# Patient Record
Sex: Male | Born: 1965
Health system: Southern US, Community
[De-identification: ages and names within clinical notes are randomized; demographics above are authoritative.]

## PROBLEM LIST (undated history)

## (undated) DIAGNOSIS — E7800 Pure hypercholesterolemia, unspecified: Secondary | ICD-10-CM

## (undated) DIAGNOSIS — Z9109 Other allergy status, other than to drugs and biological substances: Secondary | ICD-10-CM

## (undated) HISTORY — PX: HERNIA REPAIR: SHX51

## (undated) HISTORY — PX: BLADDER SURGERY: SHX569

---

## 2014-09-10 ENCOUNTER — Emergency Department (HOSPITAL_COMMUNITY): Payer: 59

## 2014-09-10 ENCOUNTER — Emergency Department (HOSPITAL_COMMUNITY)
Admission: EM | Admit: 2014-09-10 | Discharge: 2014-09-10 | Disposition: A | Discharge status: Home / Self Care | Payer: 59 | Attending: Emergency Medicine | Admitting: Emergency Medicine

## 2014-09-10 ENCOUNTER — Encounter (HOSPITAL_COMMUNITY): Payer: Self-pay | Admitting: *Deleted

## 2014-09-10 DIAGNOSIS — R079 Chest pain, unspecified: Secondary | ICD-10-CM | POA: Diagnosis present

## 2014-09-10 DIAGNOSIS — R1011 Right upper quadrant pain: Secondary | ICD-10-CM | POA: Diagnosis not present

## 2014-09-10 DIAGNOSIS — Z7982 Long term (current) use of aspirin: Secondary | ICD-10-CM | POA: Insufficient documentation

## 2014-09-10 DIAGNOSIS — Z79899 Other long term (current) drug therapy: Secondary | ICD-10-CM | POA: Diagnosis not present

## 2014-09-10 DIAGNOSIS — R0789 Other chest pain: Secondary | ICD-10-CM | POA: Diagnosis not present

## 2014-09-10 DIAGNOSIS — R10811 Right upper quadrant abdominal tenderness: Secondary | ICD-10-CM

## 2014-09-10 HISTORY — DX: Other allergy status, other than to drugs and biological substances: Z91.09

## 2014-09-10 LAB — CBC
HCT: 42.5 % (ref 39.0–52.0)
HEMOGLOBIN: 13.8 g/dL (ref 13.0–17.0)
MCH: 27.7 pg (ref 26.0–34.0)
MCHC: 32.5 g/dL (ref 30.0–36.0)
MCV: 85.3 fL (ref 78.0–100.0)
PLATELETS: 239 10*3/uL (ref 150–400)
RBC: 4.98 MIL/uL (ref 4.22–5.81)
RDW: 14 % (ref 11.5–15.5)
WBC: 12 10*3/uL — AB (ref 4.0–10.5)

## 2014-09-10 LAB — DIFFERENTIAL
Basophils Absolute: 0 10*3/uL (ref 0.0–0.1)
Basophils Relative: 0 % (ref 0–1)
EOS PCT: 1 % (ref 0–5)
Eosinophils Absolute: 0.1 10*3/uL (ref 0.0–0.7)
Lymphocytes Relative: 13 % (ref 12–46)
Lymphs Abs: 1.6 10*3/uL (ref 0.7–4.0)
Monocytes Absolute: 0.9 10*3/uL (ref 0.1–1.0)
Monocytes Relative: 8 % (ref 3–12)
NEUTROS PCT: 78 % — AB (ref 43–77)
Neutro Abs: 9.7 10*3/uL — ABNORMAL HIGH (ref 1.7–7.7)

## 2014-09-10 LAB — COMPREHENSIVE METABOLIC PANEL
ALBUMIN: 4.5 g/dL (ref 3.5–5.2)
ALK PHOS: 68 U/L (ref 39–117)
ALT: 25 U/L (ref 0–53)
AST: 29 U/L (ref 0–37)
Anion gap: 9 (ref 5–15)
BUN: 19 mg/dL (ref 6–23)
CALCIUM: 9.4 mg/dL (ref 8.4–10.5)
CHLORIDE: 108 mmol/L (ref 96–112)
CO2: 23 mmol/L (ref 19–32)
Creatinine, Ser: 1.08 mg/dL (ref 0.50–1.35)
GFR calc non Af Amer: 79 mL/min — ABNORMAL LOW (ref >90)
GLUCOSE: 117 mg/dL — AB (ref 70–99)
Potassium: 4.1 mmol/L (ref 3.5–5.1)
SODIUM: 140 mmol/L (ref 135–145)
TOTAL PROTEIN: 7.7 g/dL (ref 6.0–8.3)
Total Bilirubin: 0.8 mg/dL (ref 0.3–1.2)

## 2014-09-10 LAB — TROPONIN I: Troponin I: <0.03 ng/mL (ref <0.031)

## 2014-09-10 LAB — LIPASE, BLOOD: LIPASE: 33 U/L (ref 11–59)

## 2014-09-10 MED ORDER — NAPROXEN 500 MG PO TABS: 500.0000 mg | ORAL_TABLET | Freq: Two times a day (BID) | ORAL | Status: DC | PRN

## 2014-09-10 MED ORDER — SODIUM CHLORIDE 0.9 % IV SOLN
Freq: Once | INTRAVENOUS | Status: AC
  Administered 2014-09-10: 08:00:00 via INTRAVENOUS

## 2014-09-10 MED ORDER — MORPHINE SULFATE 4 MG/ML IJ SOLN
4.0000 mg | Freq: Once | INTRAMUSCULAR | Status: AC
  Administered 2014-09-10: 4 mg via INTRAVENOUS
  Filled 2014-09-10: qty 1

## 2014-09-10 MED ORDER — ASPIRIN 81 MG PO CHEW
243.0000 mg | CHEWABLE_TABLET | Freq: Once | ORAL | Status: AC
  Administered 2014-09-10: 243 mg via ORAL
  Filled 2014-09-10: qty 3

## 2014-09-10 MED ORDER — HYDROCODONE-ACETAMINOPHEN 5-325 MG PO TABS: 1.0000 | ORAL_TABLET | Freq: Four times a day (QID) | ORAL | Status: DC | PRN

## 2014-09-10 NOTE — ED Provider Notes (Signed)
CSN: [id number]     Arrival date & time 09/10/14  0445 History   First MD Initiated Contact with Patient 09/10/14 [phone]     Chief Complaint  Patient presents with  . Chest Pain     (Consider location/radiation/quality/duration/timing/severity/associated sxs/prior Treatment) HPI Comments: Patient states he was trying to go to bed around midnight and when he lay back, developed right-sided chest pain that was worse when he was taking a deep breath.  When he sat up, the pain was slightly relieved.  It's been increasing in intensity since that time.  Denies any nausea or vomiting, diaphoresis, shortness of breath, recent illnesses.  No history of asthma.  Patient sees his immediate last meal was excellently 7 PM parsley 5 hours before you lie down.  He has no history of gallbladder disease.  He has not traveled recently for long periods of time.  He is a nonsmoker.  He does have a history of upper respiratory seasonal allergies  Patient is a 49 y.o. male presenting with chest pain. The history is provided by the patient.  Chest Pain Pain location:  R chest Pain quality: aching   Pain radiates to:  R shoulder Pain radiates to the back: no   Pain severity:  Moderate Onset quality:  Gradual Timing:  Intermittent Progression:  Unchanged Chronicity:  New Context: breathing and lifting   Relieved by:  None tried Exacerbated by: Lying back. Associated symptoms: no abdominal pain, no anxiety, no cough, no diaphoresis, no dizziness, no fever, no heartburn, no lower extremity edema, no nausea, no near-syncope, no numbness, no orthopnea, no palpitations, no shortness of breath and not vomiting   Risk factors: male sex   Risk factors: no diabetes mellitus, no high cholesterol, no hypertension and no smoking     Past Medical History  Diagnosis Date  . Environmental allergies    Past Surgical History  Procedure Laterality Date  . Hernia repair    . Bladder surgery     No family history on  file. History  Substance Use Topics  . Smoking status: Never Smoker   . Smokeless tobacco: Not on file  . Alcohol Use: 4.2 oz/week    7 Cans of beer per week    Review of Systems  Constitutional: Negative for fever and diaphoresis.  Respiratory: Negative for cough, choking, chest tightness, shortness of breath and wheezing.   Cardiovascular: Positive for chest pain. Negative for palpitations, orthopnea, leg swelling and near-syncope.  Gastrointestinal: Negative for heartburn, nausea, vomiting, abdominal pain and rectal pain.  Genitourinary: Negative for dysuria.  Skin: Negative for wound.  Neurological: Negative for dizziness and numbness.  All other systems reviewed and are negative.     Allergies  Adhesive and Loprox  Home Medications   Prior to Admission medications   Medication Sig Start Date End Date Taking? Authorizing Provider  acetaminophen (TYLENOL) 500 MG tablet Take 1,000 mg by mouth every 6 (six) hours as needed for mild pain.   Yes Historical Provider, MD  aspirin 81 MG chewable tablet Chew 162 mg by mouth once.   Yes Historical Provider, MD  cephALEXin (KEFLEX) 500 MG capsule Take 1 capsule by mouth 2 (two) times daily. For 10 days 08/29/14   Historical Provider, MD  Choline Fenofibrate 135 MG capsule Take 135 mg by mouth at bedtime.   Yes Historical Provider, MD  diphenhydrAMINE (BENADRYL) 2 % cream Apply 1 application topically 3 (three) times daily as needed for itching.   Yes Historical Provider, MD  diphenhydrAMINE (BENADRYL) 25 mg capsule Take 25-50 mg by mouth at bedtime as needed for sleep.   Yes Historical Provider, MD  esomeprazole (NEXIUM) 40 MG capsule Take 40 mg by mouth at bedtime as needed (indigestion).   Yes Historical Provider, MD  HYDROcodone-acetaminophen (NORCO) 5-325 MG per tablet Take 1-2 tablets by mouth every 6 (six) hours as needed for severe pain. 09/10/14   Mercedes Strupp Camprubi-Soms, PA-C  montelukast (SINGULAIR) 10 MG tablet Take 10 mg  by mouth at bedtime.   Yes Historical Provider, MD  naproxen (NAPROSYN) 500 MG tablet Take 1 tablet (500 mg total) by mouth 2 (two) times daily as needed for mild pain, moderate pain or headache (TAKE WITH MEALS.). 09/10/14   Patty Sermons Camprubi-Soms, PA-C  simvastatin (ZOCOR) 40 MG tablet Take 40 mg by mouth daily at 6 PM.   Yes Historical Provider, MD  zolpidem (AMBIEN CR) 6.25 MG CR tablet Take 6.25 mg by mouth at bedtime as needed for sleep.   Yes Historical Provider, MD   BP 118/65 mmHg  Pulse 73  Temp(Src) 98.1 F (36.7 C) (Oral)  Resp 22  SpO2 99% Physical Exam  Constitutional: He is oriented to person, place, and time. He appears well-developed and well-nourished.  HENT:  Head: Normocephalic.  Eyes: Pupils are equal, round, and reactive to light.  Neck: Normal range of motion.  Cardiovascular: Normal rate and regular rhythm.   Pulmonary/Chest: Effort normal and breath sounds normal.  Abdominal: He exhibits no distension.  Musculoskeletal: Normal range of motion. He exhibits no edema or tenderness.  Neurological: He is alert and oriented to person, place, and time.  Skin: Skin is warm and dry.    ED Course  Procedures (including critical care time) Labs Review Labs Reviewed  CBC - Abnormal; Notable for the following:    WBC 12.0 (*)    All other components within normal limits  COMPREHENSIVE METABOLIC PANEL - Abnormal; Notable for the following:    Glucose, Bld 117 (*)    GFR calc non Af Amer 79 (*)    All other components within normal limits  DIFFERENTIAL - Abnormal; Notable for the following:    Neutrophils Relative % 78 (*)    Neutro Abs 9.7 (*)    All other components within normal limits  LIPASE, BLOOD  TROPONIN I  TROPONIN I    Imaging Review Dg Chest 2 View  09/10/2014   CLINICAL DATA:  Progressive worsening right-sided chest pain.  EXAM: CHEST  2 VIEW  COMPARISON:  None.  FINDINGS: Lung volumes are low. There is linear bibasilar atelectasis or  scarring. The cardiomediastinal contours are normal. Pulmonary vasculature is normal. No consolidation, pleural effusion, or pneumothorax. No acute osseous abnormalities are seen.  IMPRESSION: Hypoventilatory chest with bibasilar atelectasis.   Electronically Signed   By: Jeb Levering M.D.   On: 09/10/2014 06:16   US Abdomen Limited Ruq  09/10/2014   CLINICAL DATA:  Right upper quadrant tenderness  EXAM: US ABDOMEN LIMITED - RIGHT UPPER QUADRANT  COMPARISON:  None.  FINDINGS: Gallbladder:  No gallstones, gallbladder wall thickening, or pericholecystic fluid. Negative sonographic Murphy's sign.  Common bile duct:  Diameter: 4 mm  Liver:  Hyperechoic hepatic parenchyma, suggesting hepatic steatosis. No focal hepatic lesion is seen.  IMPRESSION: Suspected hepatic steatosis.   Electronically Signed   By: Julian Hy M.D.   On: 09/10/2014 10:16     EKG Interpretation   Date/Time:  Sunday September 10 2014 04:59:41 EST Ventricular Rate:  74 PR Interval:  107 QRS Duration: 99 QT Interval:  392 QTC Calculation: 435 R Axis:   155 Text Interpretation:  Right and left arm electrode reversal Sinus  rhythm  Short PR interval Confirmed by Wilson Singer  MD, STEPHEN ([id number]) on 09/10/2014  8:36:30 AM      MDM   Final diagnoses:  Chest pain  RUQ abdominal tenderness  Atypical chest pain         Garald Balding, NP 09/10/14 1950  Virgel Manifold, MD 09/11/14 [phone]

## 2014-09-10 NOTE — ED Notes (Signed)
Per pt report: pt began having pain in the right side of his chest that began around midnight this past evening.  Pt reports progressively worsening pain.  Pain increases with breathing.  Pt denies SOB, N/V.  No diaphoresis noted. Pt reports pain radiates up to his right shoulder. Pt took low dose aspirin prior to arrival to ED.

## 2014-09-10 NOTE — ED Notes (Signed)
EDP at bedside  

## 2014-09-10 NOTE — Discharge Instructions (Signed)
Your chest pain could be from a variety of different sources. Nothing concerning showed up on your work up today. Please see your regular doctor on Monday for recheck, but if anything changes or worsens then return to the ER. Use naprosyn and norco as needed for pain. Use your home medications as usual.   Chest Pain (Nonspecific) It is often hard to give a specific diagnosis for the cause of chest pain. There is always a chance that your pain could be related to something serious, such as a heart attack or a blood clot in the lungs. You need to follow up with your health care provider for further evaluation. CAUSES   Heartburn.  Pneumonia or bronchitis.  Anxiety or stress.  Inflammation around your heart (pericarditis) or lung (pleuritis or pleurisy).  A blood clot in the lung.  A collapsed lung (pneumothorax). It can develop suddenly on its own (spontaneous pneumothorax) or from trauma to the chest.  Shingles infection (herpes zoster virus). The chest wall is composed of bones, muscles, and cartilage. Any of these can be the source of the pain.  The bones can be bruised by injury.  The muscles or cartilage can be strained by coughing or overwork.  The cartilage can be affected by inflammation and become sore (costochondritis). DIAGNOSIS  Lab tests or other studies may be needed to find the cause of your pain. Your health care provider may have you take a test called an ambulatory electrocardiogram (ECG). An ECG records your heartbeat patterns over a 24-hour period. You may also have other tests, such as:  Transthoracic echocardiogram (TTE). During echocardiography, sound waves are used to evaluate how blood flows through your heart.  Transesophageal echocardiogram (TEE).  Cardiac monitoring. This allows your health care provider to monitor your heart rate and rhythm in real time.  Holter monitor. This is a portable device that records your heartbeat and can help diagnose heart  arrhythmias. It allows your health care provider to track your heart activity for several days, if needed.  Stress tests by exercise or by giving medicine that makes the heart beat faster. TREATMENT   Treatment depends on what may be causing your chest pain. Treatment may include:  Acid blockers for heartburn.  Anti-inflammatory medicine.  Pain medicine for inflammatory conditions.  Antibiotics if an infection is present.  You may be advised to change lifestyle habits. This includes stopping smoking and avoiding alcohol, caffeine, and chocolate.  You may be advised to keep your head raised (elevated) when sleeping. This reduces the chance of acid going backward from your stomach into your esophagus. Most of the time, nonspecific chest pain will improve within 2-3 days with rest and mild pain medicine.  HOME CARE INSTRUCTIONS   If antibiotics were prescribed, take them as directed. Finish them even if you start to feel better.  For the next few days, avoid physical activities that bring on chest pain. Continue physical activities as directed.  Do not use any tobacco products, including cigarettes, chewing tobacco, or electronic cigarettes.  Avoid drinking alcohol.  Only take medicine as directed by your health care provider.  Follow your health care provider's suggestions for further testing if your chest pain does not go away.  Keep any follow-up appointments you made. If you do not go to an appointment, you could develop lasting (chronic) problems with pain. If there is any problem keeping an appointment, call to reschedule. SEEK MEDICAL CARE IF:   Your chest pain does not go away, even  after treatment.  You have a rash with blisters on your chest.  You have a fever. SEEK IMMEDIATE MEDICAL CARE IF:   You have increased chest pain or pain that spreads to your arm, neck, jaw, back, or abdomen.  You have shortness of breath.  You have an increasing cough, or you cough up  blood.  You have severe back or abdominal pain.  You feel nauseous or vomit.  You have severe weakness.  You faint.  You have chills. This is an emergency. Do not wait to see if the pain will go away. Get medical help at once. Call your local emergency services (911 in [place] Do not drive yourself to the hospital. MAKE SURE YOU:   Understand these instructions.  Will watch your condition.  Will get help right away if you are not doing well or get worse. Document Released: 04/30/2005 Document Revised: 07/26/2013 Document Reviewed: 02/24/2008 [facility] Patient Information 2015 [place], [place]. This information is not intended to replace advice given to you by your health care provider. Make sure you discuss any questions you have with your health care provider.

## 2014-09-10 NOTE — ED Provider Notes (Signed)
Care assumed from Aaron Creamer NP at shift change. Pt here with R sided chest pain radiating to R shoulder worse with inspiration. No cardiac history. Ate at 7pm and pain began at midnight. Could be gallbladder although not tender. Has been doing heavy lifting therefore could be musculoskeletal although chest pain not reproducible. No N/V. Cardiac work up pending.   8:07 AM- more history obtained. Pt denies hx of DVT/PE in himself or family. No recent travel or immobilization/surgery. No cough. No LE swelling. Ate tacos last night at 7pm, pain started at 12am. FHx of cardiac disease in father (triple bypass, unknown if he ever had MI). No FHx of DVT/PE. Denies radiation of symptoms to his back, just to R shoulder.   Physical Exam  BP 107/65 mmHg  Pulse 74  Temp(Src) 98 F (36.7 C) (Oral)  Resp 25  SpO2 99%  Physical Exam Gen: afebrile, nontoxic, NAD although slightly tachypneic due to shallow breathing. VSS HEENT: EOMI, MMM Resp: no resp distress, CTAB in all lung fields, no w/r/r, no hypoxia or increased WOB, mildly tachypneic due to shallow breathing, speaking in full sentences, SpO2 97% on RA  CV: RRR, nl s1/s2, no m/r/g, distal pulses intact, no pedal edema. No chest wall TTP Abd: Soft, ND, +BS throughout, with very mild RUQ abd pain that reproduces his chest pain, no r/g/r, neg murphy's (although he reports discomfort during this, he's able to fully inspire), neg mcburney's, no CVA TTP  MsK: moving all extremities with ease Neuro: A&O x4  ED Course  Procedures Results for orders placed or performed during the hospital encounter of 09/10/14  CBC  Result Value Ref Range   WBC 12.0 (H) 4.0 - 10.5 K/uL   RBC 4.98 4.22 - 5.81 MIL/uL   Hemoglobin 13.8 13.0 - 17.0 g/dL   HCT 42.5 39.0 - 52.0 %   MCV 85.3 78.0 - 100.0 fL   MCH 27.7 26.0 - 34.0 pg   MCHC 32.5 30.0 - 36.0 g/dL   RDW 14.0 11.5 - 15.5 %   Platelets 239 150 - 400 K/uL  Comprehensive metabolic panel  Result Value Ref Range    Sodium 140 135 - 145 mmol/L   Potassium 4.1 3.5 - 5.1 mmol/L   Chloride 108 96 - 112 mmol/L   CO2 23 19 - 32 mmol/L   Glucose, Bld 117 (H) 70 - 99 mg/dL   BUN 19 6 - 23 mg/dL   Creatinine, Ser 1.08 0.50 - 1.35 mg/dL   Calcium 9.4 8.4 - 10.5 mg/dL   Total Protein 7.7 6.0 - 8.3 g/dL   Albumin 4.5 3.5 - 5.2 g/dL   AST 29 0 - 37 U/L   ALT 25 0 - 53 U/L   Alkaline Phosphatase 68 39 - 117 U/L   Total Bilirubin 0.8 0.3 - 1.2 mg/dL   GFR calc non Af Amer 79 (L) >90 mL/min   GFR calc Af Amer >90 >90 mL/min   Anion gap 9 5 - 15  Lipase, blood  Result Value Ref Range   Lipase 33 11 - 59 U/L  Troponin I  Result Value Ref Range   Troponin I <0.03 <0.031 ng/mL  Troponin I  Result Value Ref Range   Troponin I <0.03 <0.031 ng/mL  Differential  Result Value Ref Range   Neutrophils Relative % 78 (H) 43 - 77 %   Neutro Abs 9.7 (H) 1.7 - 7.7 K/uL   Lymphocytes Relative 13 12 - 46 %   Lymphs Abs  1.6 0.7 - 4.0 K/uL   Monocytes Relative 8 3 - 12 %   Monocytes Absolute 0.9 0.1 - 1.0 K/uL   Eosinophils Relative 1 0 - 5 %   Eosinophils Absolute 0.1 0.0 - 0.7 K/uL   Basophils Relative 0 0 - 1 %   Basophils Absolute 0.0 0.0 - 0.1 K/uL    Meds ordered this encounter  Medications  . 0.9 %  sodium chloride infusion    Sig:   . aspirin chewable tablet 243 mg    Sig:   . morphine 4 MG/ML injection 4 mg    Sig:    EKG Interpretation  Date/Time:  Sunday September 10 2014 04:59:41 EST Ventricular Rate:  74 PR Interval:  107 QRS Duration: 99 QT Interval:  392 QTC Calculation: 435 R Axis:   155 Text Interpretation:  Right and left arm electrode reversal Sinus  rhythm Short PR interval Confirmed by Wilson Singer  MD, STEPHEN ([id number]) on 09/10/2014 8:36:30 AM    MDM:   ICD-9-CM ICD-10-CM   1. Atypical chest pain 786.59 R07.89   2. Chest pain 786.50 R07.9 DG Chest 2 View     DG Chest 2 View  3. RUQ abdominal tenderness 789.61 R10.811 US Abdomen Limited RUQ     US Abdomen Limited RUQ   8:11 AM CMP  WNL. Lipase WNL. CBC showing mild WBC elevation at 12.0, will obtain differential. Trop I at 5.5hrs s/p onset is neg. CXR showing poor inspiratory effort with bibasilar atelectasis. EKG showing some T inversions which are likely from limb lead reversal, no prior EKG to compare. Will give remainder of ASA dose, and low dose morphine. Will hold on NTG given his BPs being on lower end of normal. Pt has some mild RUQ TTP on my exam, will obtain U/S to eval for gallbladder etiology. PERC negative, and no tachycardia or hypoxia, therefore doubt DVT/PE. HEART score 2. Will hold on GI cocktail, pt states he doesn't have indigestion and doesn't want to try this now. Will reassess shortly.  11:29 AM Second trop neg. Differential shows neutrophilic predominance but not entirely specific. RUQ U/S showing hepatic steatosis. Pain somewhat improved after morphine. VS remain completely stable, tachypnea resolved. Discussed with pt that he has low risk factors and I doubt he needs admission for cardiac work up since this didn't appear to be cardiac in nature. Unclear etiology, but discussed f/up with PCP on Monday, but if any changes or worsening occurred he should return and admission for cardiac work up could be pursued. Will give naprosyn and norco for pain. I explained the diagnosis and have given explicit precautions to return to the ER including for any other new or worsening symptoms. The patient understands and accepts the medical plan as it's been dictated and I have answered their questions. Discharge instructions concerning home care and prescriptions have been given. The patient is STABLE and is discharged to home in good condition.  BP 118/65 mmHg  Pulse 73  Temp(Src) 98.1 F (36.7 C) (Oral)  Resp 22  SpO2 99%    Patty Sermons [place], PA-C 09/10/14 [place], MD 09/11/14 [phone]

## 2016-08-06 DIAGNOSIS — J3081 Allergic rhinitis due to animal (cat) (dog) hair and dander: Secondary | ICD-10-CM | POA: Diagnosis not present

## 2016-08-06 DIAGNOSIS — J3089 Other allergic rhinitis: Secondary | ICD-10-CM | POA: Diagnosis not present

## 2016-08-06 DIAGNOSIS — J301 Allergic rhinitis due to pollen: Secondary | ICD-10-CM | POA: Diagnosis not present

## 2016-08-13 DIAGNOSIS — J3081 Allergic rhinitis due to animal (cat) (dog) hair and dander: Secondary | ICD-10-CM | POA: Diagnosis not present

## 2016-08-13 DIAGNOSIS — J3089 Other allergic rhinitis: Secondary | ICD-10-CM | POA: Diagnosis not present

## 2016-08-13 DIAGNOSIS — J301 Allergic rhinitis due to pollen: Secondary | ICD-10-CM | POA: Diagnosis not present

## 2016-08-25 DIAGNOSIS — E559 Vitamin D deficiency, unspecified: Secondary | ICD-10-CM | POA: Diagnosis not present

## 2016-08-25 DIAGNOSIS — E785 Hyperlipidemia, unspecified: Secondary | ICD-10-CM | POA: Diagnosis not present

## 2016-08-25 DIAGNOSIS — K219 Gastro-esophageal reflux disease without esophagitis: Secondary | ICD-10-CM | POA: Diagnosis not present

## 2016-08-25 DIAGNOSIS — Z0000 Encounter for general adult medical examination without abnormal findings: Secondary | ICD-10-CM | POA: Diagnosis not present

## 2016-08-25 DIAGNOSIS — Z79899 Other long term (current) drug therapy: Secondary | ICD-10-CM | POA: Diagnosis not present

## 2016-08-28 DIAGNOSIS — J3081 Allergic rhinitis due to animal (cat) (dog) hair and dander: Secondary | ICD-10-CM | POA: Diagnosis not present

## 2016-08-28 DIAGNOSIS — J3089 Other allergic rhinitis: Secondary | ICD-10-CM | POA: Diagnosis not present

## 2016-08-28 DIAGNOSIS — J301 Allergic rhinitis due to pollen: Secondary | ICD-10-CM | POA: Diagnosis not present

## 2016-09-03 DIAGNOSIS — J3089 Other allergic rhinitis: Secondary | ICD-10-CM | POA: Diagnosis not present

## 2016-09-03 DIAGNOSIS — J301 Allergic rhinitis due to pollen: Secondary | ICD-10-CM | POA: Diagnosis not present

## 2016-09-03 DIAGNOSIS — J3081 Allergic rhinitis due to animal (cat) (dog) hair and dander: Secondary | ICD-10-CM | POA: Diagnosis not present

## 2016-09-08 DIAGNOSIS — J301 Allergic rhinitis due to pollen: Secondary | ICD-10-CM | POA: Diagnosis not present

## 2016-09-09 DIAGNOSIS — J3089 Other allergic rhinitis: Secondary | ICD-10-CM | POA: Diagnosis not present

## 2016-09-09 DIAGNOSIS — J3081 Allergic rhinitis due to animal (cat) (dog) hair and dander: Secondary | ICD-10-CM | POA: Diagnosis not present

## 2016-09-10 DIAGNOSIS — J3081 Allergic rhinitis due to animal (cat) (dog) hair and dander: Secondary | ICD-10-CM | POA: Diagnosis not present

## 2016-09-10 DIAGNOSIS — J301 Allergic rhinitis due to pollen: Secondary | ICD-10-CM | POA: Diagnosis not present

## 2016-09-10 DIAGNOSIS — J3089 Other allergic rhinitis: Secondary | ICD-10-CM | POA: Diagnosis not present

## 2016-09-17 DIAGNOSIS — J301 Allergic rhinitis due to pollen: Secondary | ICD-10-CM | POA: Diagnosis not present

## 2016-09-17 DIAGNOSIS — J3089 Other allergic rhinitis: Secondary | ICD-10-CM | POA: Diagnosis not present

## 2016-09-17 DIAGNOSIS — J3081 Allergic rhinitis due to animal (cat) (dog) hair and dander: Secondary | ICD-10-CM | POA: Diagnosis not present

## 2016-09-24 DIAGNOSIS — J3081 Allergic rhinitis due to animal (cat) (dog) hair and dander: Secondary | ICD-10-CM | POA: Diagnosis not present

## 2016-09-24 DIAGNOSIS — J301 Allergic rhinitis due to pollen: Secondary | ICD-10-CM | POA: Diagnosis not present

## 2016-09-24 DIAGNOSIS — J3089 Other allergic rhinitis: Secondary | ICD-10-CM | POA: Diagnosis not present

## 2016-09-26 DIAGNOSIS — H903 Sensorineural hearing loss, bilateral: Secondary | ICD-10-CM | POA: Diagnosis not present

## 2016-10-01 DIAGNOSIS — J3081 Allergic rhinitis due to animal (cat) (dog) hair and dander: Secondary | ICD-10-CM | POA: Diagnosis not present

## 2016-10-01 DIAGNOSIS — J3089 Other allergic rhinitis: Secondary | ICD-10-CM | POA: Diagnosis not present

## 2016-10-01 DIAGNOSIS — J301 Allergic rhinitis due to pollen: Secondary | ICD-10-CM | POA: Diagnosis not present

## 2016-10-09 DIAGNOSIS — J3081 Allergic rhinitis due to animal (cat) (dog) hair and dander: Secondary | ICD-10-CM | POA: Diagnosis not present

## 2016-10-09 DIAGNOSIS — J3089 Other allergic rhinitis: Secondary | ICD-10-CM | POA: Diagnosis not present

## 2016-10-09 DIAGNOSIS — J301 Allergic rhinitis due to pollen: Secondary | ICD-10-CM | POA: Diagnosis not present

## 2016-10-15 DIAGNOSIS — J301 Allergic rhinitis due to pollen: Secondary | ICD-10-CM | POA: Diagnosis not present

## 2016-10-15 DIAGNOSIS — J3081 Allergic rhinitis due to animal (cat) (dog) hair and dander: Secondary | ICD-10-CM | POA: Diagnosis not present

## 2016-10-15 DIAGNOSIS — J3089 Other allergic rhinitis: Secondary | ICD-10-CM | POA: Diagnosis not present

## 2016-10-20 DIAGNOSIS — R197 Diarrhea, unspecified: Secondary | ICD-10-CM | POA: Diagnosis not present

## 2016-10-20 DIAGNOSIS — R110 Nausea: Secondary | ICD-10-CM | POA: Diagnosis not present

## 2016-10-22 DIAGNOSIS — J301 Allergic rhinitis due to pollen: Secondary | ICD-10-CM | POA: Diagnosis not present

## 2016-10-22 DIAGNOSIS — J3089 Other allergic rhinitis: Secondary | ICD-10-CM | POA: Diagnosis not present

## 2016-10-22 DIAGNOSIS — J3081 Allergic rhinitis due to animal (cat) (dog) hair and dander: Secondary | ICD-10-CM | POA: Diagnosis not present

## 2016-10-23 DIAGNOSIS — R110 Nausea: Secondary | ICD-10-CM | POA: Diagnosis not present

## 2016-10-23 DIAGNOSIS — R1031 Right lower quadrant pain: Secondary | ICD-10-CM | POA: Diagnosis not present

## 2016-10-29 DIAGNOSIS — J3089 Other allergic rhinitis: Secondary | ICD-10-CM | POA: Diagnosis not present

## 2016-10-29 DIAGNOSIS — J3081 Allergic rhinitis due to animal (cat) (dog) hair and dander: Secondary | ICD-10-CM | POA: Diagnosis not present

## 2016-10-29 DIAGNOSIS — J301 Allergic rhinitis due to pollen: Secondary | ICD-10-CM | POA: Diagnosis not present

## 2016-11-05 DIAGNOSIS — J301 Allergic rhinitis due to pollen: Secondary | ICD-10-CM | POA: Diagnosis not present

## 2016-11-05 DIAGNOSIS — J3089 Other allergic rhinitis: Secondary | ICD-10-CM | POA: Diagnosis not present

## 2016-11-05 DIAGNOSIS — J3081 Allergic rhinitis due to animal (cat) (dog) hair and dander: Secondary | ICD-10-CM | POA: Diagnosis not present

## 2016-11-10 DIAGNOSIS — G4733 Obstructive sleep apnea (adult) (pediatric): Secondary | ICD-10-CM | POA: Diagnosis not present

## 2016-11-12 DIAGNOSIS — J3089 Other allergic rhinitis: Secondary | ICD-10-CM | POA: Diagnosis not present

## 2016-11-12 DIAGNOSIS — J3081 Allergic rhinitis due to animal (cat) (dog) hair and dander: Secondary | ICD-10-CM | POA: Diagnosis not present

## 2016-11-12 DIAGNOSIS — J301 Allergic rhinitis due to pollen: Secondary | ICD-10-CM | POA: Diagnosis not present

## 2016-11-19 DIAGNOSIS — J3081 Allergic rhinitis due to animal (cat) (dog) hair and dander: Secondary | ICD-10-CM | POA: Diagnosis not present

## 2016-11-19 DIAGNOSIS — J3089 Other allergic rhinitis: Secondary | ICD-10-CM | POA: Diagnosis not present

## 2016-11-19 DIAGNOSIS — L821 Other seborrheic keratosis: Secondary | ICD-10-CM | POA: Diagnosis not present

## 2016-11-19 DIAGNOSIS — L820 Inflamed seborrheic keratosis: Secondary | ICD-10-CM | POA: Diagnosis not present

## 2016-11-19 DIAGNOSIS — D1800 Hemangioma unspecified site: Secondary | ICD-10-CM | POA: Diagnosis not present

## 2016-11-19 DIAGNOSIS — L814 Other melanin hyperpigmentation: Secondary | ICD-10-CM | POA: Diagnosis not present

## 2016-11-19 DIAGNOSIS — J301 Allergic rhinitis due to pollen: Secondary | ICD-10-CM | POA: Diagnosis not present

## 2016-11-20 DIAGNOSIS — G4733 Obstructive sleep apnea (adult) (pediatric): Secondary | ICD-10-CM | POA: Diagnosis not present

## 2016-11-26 DIAGNOSIS — J3089 Other allergic rhinitis: Secondary | ICD-10-CM | POA: Diagnosis not present

## 2016-11-26 DIAGNOSIS — J3081 Allergic rhinitis due to animal (cat) (dog) hair and dander: Secondary | ICD-10-CM | POA: Diagnosis not present

## 2016-11-26 DIAGNOSIS — J301 Allergic rhinitis due to pollen: Secondary | ICD-10-CM | POA: Diagnosis not present

## 2016-12-05 DIAGNOSIS — J301 Allergic rhinitis due to pollen: Secondary | ICD-10-CM | POA: Diagnosis not present

## 2016-12-05 DIAGNOSIS — J3089 Other allergic rhinitis: Secondary | ICD-10-CM | POA: Diagnosis not present

## 2016-12-05 DIAGNOSIS — J3081 Allergic rhinitis due to animal (cat) (dog) hair and dander: Secondary | ICD-10-CM | POA: Diagnosis not present

## 2016-12-10 DIAGNOSIS — J301 Allergic rhinitis due to pollen: Secondary | ICD-10-CM | POA: Diagnosis not present

## 2016-12-10 DIAGNOSIS — J3089 Other allergic rhinitis: Secondary | ICD-10-CM | POA: Diagnosis not present

## 2016-12-10 DIAGNOSIS — J3081 Allergic rhinitis due to animal (cat) (dog) hair and dander: Secondary | ICD-10-CM | POA: Diagnosis not present

## 2016-12-17 DIAGNOSIS — J3081 Allergic rhinitis due to animal (cat) (dog) hair and dander: Secondary | ICD-10-CM | POA: Diagnosis not present

## 2016-12-17 DIAGNOSIS — J3089 Other allergic rhinitis: Secondary | ICD-10-CM | POA: Diagnosis not present

## 2016-12-17 DIAGNOSIS — J301 Allergic rhinitis due to pollen: Secondary | ICD-10-CM | POA: Diagnosis not present

## 2016-12-24 DIAGNOSIS — J3089 Other allergic rhinitis: Secondary | ICD-10-CM | POA: Diagnosis not present

## 2016-12-24 DIAGNOSIS — J3081 Allergic rhinitis due to animal (cat) (dog) hair and dander: Secondary | ICD-10-CM | POA: Diagnosis not present

## 2016-12-24 DIAGNOSIS — J301 Allergic rhinitis due to pollen: Secondary | ICD-10-CM | POA: Diagnosis not present

## 2016-12-31 DIAGNOSIS — J3089 Other allergic rhinitis: Secondary | ICD-10-CM | POA: Diagnosis not present

## 2016-12-31 DIAGNOSIS — J301 Allergic rhinitis due to pollen: Secondary | ICD-10-CM | POA: Diagnosis not present

## 2016-12-31 DIAGNOSIS — J3081 Allergic rhinitis due to animal (cat) (dog) hair and dander: Secondary | ICD-10-CM | POA: Diagnosis not present

## 2017-01-05 DIAGNOSIS — H1045 Other chronic allergic conjunctivitis: Secondary | ICD-10-CM | POA: Diagnosis not present

## 2017-01-05 DIAGNOSIS — J3089 Other allergic rhinitis: Secondary | ICD-10-CM | POA: Diagnosis not present

## 2017-01-05 DIAGNOSIS — J301 Allergic rhinitis due to pollen: Secondary | ICD-10-CM | POA: Diagnosis not present

## 2017-01-07 DIAGNOSIS — J3081 Allergic rhinitis due to animal (cat) (dog) hair and dander: Secondary | ICD-10-CM | POA: Diagnosis not present

## 2017-01-07 DIAGNOSIS — J301 Allergic rhinitis due to pollen: Secondary | ICD-10-CM | POA: Diagnosis not present

## 2017-01-07 DIAGNOSIS — J3089 Other allergic rhinitis: Secondary | ICD-10-CM | POA: Diagnosis not present

## 2017-01-21 DIAGNOSIS — J3081 Allergic rhinitis due to animal (cat) (dog) hair and dander: Secondary | ICD-10-CM | POA: Diagnosis not present

## 2017-01-21 DIAGNOSIS — J3089 Other allergic rhinitis: Secondary | ICD-10-CM | POA: Diagnosis not present

## 2017-01-21 DIAGNOSIS — J301 Allergic rhinitis due to pollen: Secondary | ICD-10-CM | POA: Diagnosis not present

## 2017-01-28 DIAGNOSIS — J301 Allergic rhinitis due to pollen: Secondary | ICD-10-CM | POA: Diagnosis not present

## 2017-01-28 DIAGNOSIS — J3081 Allergic rhinitis due to animal (cat) (dog) hair and dander: Secondary | ICD-10-CM | POA: Diagnosis not present

## 2017-01-28 DIAGNOSIS — J3089 Other allergic rhinitis: Secondary | ICD-10-CM | POA: Diagnosis not present

## 2017-02-06 DIAGNOSIS — J3081 Allergic rhinitis due to animal (cat) (dog) hair and dander: Secondary | ICD-10-CM | POA: Diagnosis not present

## 2017-02-06 DIAGNOSIS — J3089 Other allergic rhinitis: Secondary | ICD-10-CM | POA: Diagnosis not present

## 2017-02-06 DIAGNOSIS — J301 Allergic rhinitis due to pollen: Secondary | ICD-10-CM | POA: Diagnosis not present

## 2017-02-09 DIAGNOSIS — H1045 Other chronic allergic conjunctivitis: Secondary | ICD-10-CM | POA: Diagnosis not present

## 2017-02-09 DIAGNOSIS — J301 Allergic rhinitis due to pollen: Secondary | ICD-10-CM | POA: Diagnosis not present

## 2017-02-09 DIAGNOSIS — J3089 Other allergic rhinitis: Secondary | ICD-10-CM | POA: Diagnosis not present

## 2017-02-18 ENCOUNTER — Other Ambulatory Visit: Payer: Self-pay | Admitting: Family Medicine

## 2017-02-18 DIAGNOSIS — J301 Allergic rhinitis due to pollen: Secondary | ICD-10-CM | POA: Diagnosis not present

## 2017-02-18 DIAGNOSIS — J3081 Allergic rhinitis due to animal (cat) (dog) hair and dander: Secondary | ICD-10-CM | POA: Diagnosis not present

## 2017-02-18 DIAGNOSIS — N5089 Other specified disorders of the male genital organs: Secondary | ICD-10-CM

## 2017-02-18 DIAGNOSIS — J3089 Other allergic rhinitis: Secondary | ICD-10-CM | POA: Diagnosis not present

## 2017-02-23 ENCOUNTER — Ambulatory Visit
Admission: RE | Admit: 2017-02-23 | Discharge: 2017-02-23 | Disposition: A | Discharge status: Home / Self Care | Payer: 59 | Source: Ambulatory Visit | Attending: Family Medicine | Admitting: Family Medicine

## 2017-02-23 DIAGNOSIS — N5089 Other specified disorders of the male genital organs: Secondary | ICD-10-CM

## 2017-02-25 DIAGNOSIS — J3089 Other allergic rhinitis: Secondary | ICD-10-CM | POA: Diagnosis not present

## 2017-02-25 DIAGNOSIS — J3081 Allergic rhinitis due to animal (cat) (dog) hair and dander: Secondary | ICD-10-CM | POA: Diagnosis not present

## 2017-02-25 DIAGNOSIS — J301 Allergic rhinitis due to pollen: Secondary | ICD-10-CM | POA: Diagnosis not present

## 2017-03-04 DIAGNOSIS — J3081 Allergic rhinitis due to animal (cat) (dog) hair and dander: Secondary | ICD-10-CM | POA: Diagnosis not present

## 2017-03-04 DIAGNOSIS — J3089 Other allergic rhinitis: Secondary | ICD-10-CM | POA: Diagnosis not present

## 2017-03-04 DIAGNOSIS — J301 Allergic rhinitis due to pollen: Secondary | ICD-10-CM | POA: Diagnosis not present

## 2017-03-11 DIAGNOSIS — J3089 Other allergic rhinitis: Secondary | ICD-10-CM | POA: Diagnosis not present

## 2017-03-11 DIAGNOSIS — J3081 Allergic rhinitis due to animal (cat) (dog) hair and dander: Secondary | ICD-10-CM | POA: Diagnosis not present

## 2017-03-11 DIAGNOSIS — J301 Allergic rhinitis due to pollen: Secondary | ICD-10-CM | POA: Diagnosis not present

## 2017-03-18 DIAGNOSIS — J3089 Other allergic rhinitis: Secondary | ICD-10-CM | POA: Diagnosis not present

## 2017-03-18 DIAGNOSIS — J3081 Allergic rhinitis due to animal (cat) (dog) hair and dander: Secondary | ICD-10-CM | POA: Diagnosis not present

## 2017-03-18 DIAGNOSIS — J301 Allergic rhinitis due to pollen: Secondary | ICD-10-CM | POA: Diagnosis not present

## 2017-03-25 DIAGNOSIS — J301 Allergic rhinitis due to pollen: Secondary | ICD-10-CM | POA: Diagnosis not present

## 2017-03-25 DIAGNOSIS — J3089 Other allergic rhinitis: Secondary | ICD-10-CM | POA: Diagnosis not present

## 2017-03-25 DIAGNOSIS — J3081 Allergic rhinitis due to animal (cat) (dog) hair and dander: Secondary | ICD-10-CM | POA: Diagnosis not present

## 2017-04-01 DIAGNOSIS — J3089 Other allergic rhinitis: Secondary | ICD-10-CM | POA: Diagnosis not present

## 2017-04-01 DIAGNOSIS — J3081 Allergic rhinitis due to animal (cat) (dog) hair and dander: Secondary | ICD-10-CM | POA: Diagnosis not present

## 2017-04-01 DIAGNOSIS — J301 Allergic rhinitis due to pollen: Secondary | ICD-10-CM | POA: Diagnosis not present

## 2017-04-09 DIAGNOSIS — J3081 Allergic rhinitis due to animal (cat) (dog) hair and dander: Secondary | ICD-10-CM | POA: Diagnosis not present

## 2017-04-09 DIAGNOSIS — J301 Allergic rhinitis due to pollen: Secondary | ICD-10-CM | POA: Diagnosis not present

## 2017-04-09 DIAGNOSIS — J3089 Other allergic rhinitis: Secondary | ICD-10-CM | POA: Diagnosis not present

## 2017-04-15 DIAGNOSIS — J3081 Allergic rhinitis due to animal (cat) (dog) hair and dander: Secondary | ICD-10-CM | POA: Diagnosis not present

## 2017-04-15 DIAGNOSIS — J3089 Other allergic rhinitis: Secondary | ICD-10-CM | POA: Diagnosis not present

## 2017-04-15 DIAGNOSIS — J301 Allergic rhinitis due to pollen: Secondary | ICD-10-CM | POA: Diagnosis not present

## 2017-04-22 DIAGNOSIS — J3081 Allergic rhinitis due to animal (cat) (dog) hair and dander: Secondary | ICD-10-CM | POA: Diagnosis not present

## 2017-04-22 DIAGNOSIS — J301 Allergic rhinitis due to pollen: Secondary | ICD-10-CM | POA: Diagnosis not present

## 2017-04-22 DIAGNOSIS — J3089 Other allergic rhinitis: Secondary | ICD-10-CM | POA: Diagnosis not present

## 2017-04-29 DIAGNOSIS — J301 Allergic rhinitis due to pollen: Secondary | ICD-10-CM | POA: Diagnosis not present

## 2017-04-29 DIAGNOSIS — J3081 Allergic rhinitis due to animal (cat) (dog) hair and dander: Secondary | ICD-10-CM | POA: Diagnosis not present

## 2017-04-29 DIAGNOSIS — J3089 Other allergic rhinitis: Secondary | ICD-10-CM | POA: Diagnosis not present

## 2017-05-04 DIAGNOSIS — G4733 Obstructive sleep apnea (adult) (pediatric): Secondary | ICD-10-CM | POA: Diagnosis not present

## 2017-05-07 DIAGNOSIS — J3089 Other allergic rhinitis: Secondary | ICD-10-CM | POA: Diagnosis not present

## 2017-05-07 DIAGNOSIS — J301 Allergic rhinitis due to pollen: Secondary | ICD-10-CM | POA: Diagnosis not present

## 2017-05-07 DIAGNOSIS — J3081 Allergic rhinitis due to animal (cat) (dog) hair and dander: Secondary | ICD-10-CM | POA: Diagnosis not present

## 2017-05-14 DIAGNOSIS — J301 Allergic rhinitis due to pollen: Secondary | ICD-10-CM | POA: Diagnosis not present

## 2017-05-15 DIAGNOSIS — J3089 Other allergic rhinitis: Secondary | ICD-10-CM | POA: Diagnosis not present

## 2017-05-15 DIAGNOSIS — J3081 Allergic rhinitis due to animal (cat) (dog) hair and dander: Secondary | ICD-10-CM | POA: Diagnosis not present

## 2017-05-22 DIAGNOSIS — J301 Allergic rhinitis due to pollen: Secondary | ICD-10-CM | POA: Diagnosis not present

## 2017-05-22 DIAGNOSIS — J3081 Allergic rhinitis due to animal (cat) (dog) hair and dander: Secondary | ICD-10-CM | POA: Diagnosis not present

## 2017-05-22 DIAGNOSIS — J3089 Other allergic rhinitis: Secondary | ICD-10-CM | POA: Diagnosis not present

## 2017-05-27 DIAGNOSIS — J3089 Other allergic rhinitis: Secondary | ICD-10-CM | POA: Diagnosis not present

## 2017-05-27 DIAGNOSIS — J301 Allergic rhinitis due to pollen: Secondary | ICD-10-CM | POA: Diagnosis not present

## 2017-05-27 DIAGNOSIS — J3081 Allergic rhinitis due to animal (cat) (dog) hair and dander: Secondary | ICD-10-CM | POA: Diagnosis not present

## 2017-06-04 DIAGNOSIS — J301 Allergic rhinitis due to pollen: Secondary | ICD-10-CM | POA: Diagnosis not present

## 2017-06-04 DIAGNOSIS — J3089 Other allergic rhinitis: Secondary | ICD-10-CM | POA: Diagnosis not present

## 2017-06-04 DIAGNOSIS — J3081 Allergic rhinitis due to animal (cat) (dog) hair and dander: Secondary | ICD-10-CM | POA: Diagnosis not present

## 2017-06-10 DIAGNOSIS — J3089 Other allergic rhinitis: Secondary | ICD-10-CM | POA: Diagnosis not present

## 2017-06-10 DIAGNOSIS — J301 Allergic rhinitis due to pollen: Secondary | ICD-10-CM | POA: Diagnosis not present

## 2017-06-10 DIAGNOSIS — J3081 Allergic rhinitis due to animal (cat) (dog) hair and dander: Secondary | ICD-10-CM | POA: Diagnosis not present

## 2017-06-17 DIAGNOSIS — J3089 Other allergic rhinitis: Secondary | ICD-10-CM | POA: Diagnosis not present

## 2017-06-17 DIAGNOSIS — J301 Allergic rhinitis due to pollen: Secondary | ICD-10-CM | POA: Diagnosis not present

## 2017-06-17 DIAGNOSIS — J3081 Allergic rhinitis due to animal (cat) (dog) hair and dander: Secondary | ICD-10-CM | POA: Diagnosis not present

## 2017-06-24 DIAGNOSIS — J3089 Other allergic rhinitis: Secondary | ICD-10-CM | POA: Diagnosis not present

## 2017-06-24 DIAGNOSIS — J301 Allergic rhinitis due to pollen: Secondary | ICD-10-CM | POA: Diagnosis not present

## 2017-06-24 DIAGNOSIS — J3081 Allergic rhinitis due to animal (cat) (dog) hair and dander: Secondary | ICD-10-CM | POA: Diagnosis not present

## 2017-07-03 DIAGNOSIS — J301 Allergic rhinitis due to pollen: Secondary | ICD-10-CM | POA: Diagnosis not present

## 2017-07-03 DIAGNOSIS — J3081 Allergic rhinitis due to animal (cat) (dog) hair and dander: Secondary | ICD-10-CM | POA: Diagnosis not present

## 2017-07-03 DIAGNOSIS — J3089 Other allergic rhinitis: Secondary | ICD-10-CM | POA: Diagnosis not present

## 2017-07-07 DIAGNOSIS — R112 Nausea with vomiting, unspecified: Secondary | ICD-10-CM | POA: Diagnosis not present

## 2017-07-07 DIAGNOSIS — R1013 Epigastric pain: Secondary | ICD-10-CM | POA: Diagnosis not present

## 2017-07-07 DIAGNOSIS — R509 Fever, unspecified: Secondary | ICD-10-CM | POA: Diagnosis not present

## 2017-07-15 DIAGNOSIS — J3081 Allergic rhinitis due to animal (cat) (dog) hair and dander: Secondary | ICD-10-CM | POA: Diagnosis not present

## 2017-07-15 DIAGNOSIS — J3089 Other allergic rhinitis: Secondary | ICD-10-CM | POA: Diagnosis not present

## 2017-07-15 DIAGNOSIS — J301 Allergic rhinitis due to pollen: Secondary | ICD-10-CM | POA: Diagnosis not present

## 2017-07-22 DIAGNOSIS — J3089 Other allergic rhinitis: Secondary | ICD-10-CM | POA: Diagnosis not present

## 2017-07-22 DIAGNOSIS — J301 Allergic rhinitis due to pollen: Secondary | ICD-10-CM | POA: Diagnosis not present

## 2017-07-22 DIAGNOSIS — J3081 Allergic rhinitis due to animal (cat) (dog) hair and dander: Secondary | ICD-10-CM | POA: Diagnosis not present

## 2017-07-23 DIAGNOSIS — R253 Fasciculation: Secondary | ICD-10-CM | POA: Diagnosis not present

## 2017-07-23 DIAGNOSIS — M79672 Pain in left foot: Secondary | ICD-10-CM | POA: Diagnosis not present

## 2017-08-12 DIAGNOSIS — J301 Allergic rhinitis due to pollen: Secondary | ICD-10-CM | POA: Diagnosis not present

## 2017-08-12 DIAGNOSIS — J3089 Other allergic rhinitis: Secondary | ICD-10-CM | POA: Diagnosis not present

## 2017-08-12 DIAGNOSIS — J3081 Allergic rhinitis due to animal (cat) (dog) hair and dander: Secondary | ICD-10-CM | POA: Diagnosis not present

## 2017-08-19 DIAGNOSIS — J3081 Allergic rhinitis due to animal (cat) (dog) hair and dander: Secondary | ICD-10-CM | POA: Diagnosis not present

## 2017-08-19 DIAGNOSIS — J301 Allergic rhinitis due to pollen: Secondary | ICD-10-CM | POA: Diagnosis not present

## 2017-08-19 DIAGNOSIS — J3089 Other allergic rhinitis: Secondary | ICD-10-CM | POA: Diagnosis not present

## 2017-08-26 DIAGNOSIS — J3089 Other allergic rhinitis: Secondary | ICD-10-CM | POA: Diagnosis not present

## 2017-08-26 DIAGNOSIS — J3081 Allergic rhinitis due to animal (cat) (dog) hair and dander: Secondary | ICD-10-CM | POA: Diagnosis not present

## 2017-08-26 DIAGNOSIS — J301 Allergic rhinitis due to pollen: Secondary | ICD-10-CM | POA: Diagnosis not present

## 2017-09-04 DIAGNOSIS — J301 Allergic rhinitis due to pollen: Secondary | ICD-10-CM | POA: Diagnosis not present

## 2017-09-04 DIAGNOSIS — J3089 Other allergic rhinitis: Secondary | ICD-10-CM | POA: Diagnosis not present

## 2017-09-04 DIAGNOSIS — J3081 Allergic rhinitis due to animal (cat) (dog) hair and dander: Secondary | ICD-10-CM | POA: Diagnosis not present

## 2017-09-14 DIAGNOSIS — J301 Allergic rhinitis due to pollen: Secondary | ICD-10-CM | POA: Diagnosis not present

## 2017-09-14 DIAGNOSIS — J3081 Allergic rhinitis due to animal (cat) (dog) hair and dander: Secondary | ICD-10-CM | POA: Diagnosis not present

## 2017-09-14 DIAGNOSIS — J3089 Other allergic rhinitis: Secondary | ICD-10-CM | POA: Diagnosis not present

## 2017-09-23 DIAGNOSIS — J301 Allergic rhinitis due to pollen: Secondary | ICD-10-CM | POA: Diagnosis not present

## 2017-09-23 DIAGNOSIS — J3089 Other allergic rhinitis: Secondary | ICD-10-CM | POA: Diagnosis not present

## 2017-09-23 DIAGNOSIS — J3081 Allergic rhinitis due to animal (cat) (dog) hair and dander: Secondary | ICD-10-CM | POA: Diagnosis not present

## 2017-10-02 DIAGNOSIS — J3089 Other allergic rhinitis: Secondary | ICD-10-CM | POA: Diagnosis not present

## 2017-10-02 DIAGNOSIS — J3081 Allergic rhinitis due to animal (cat) (dog) hair and dander: Secondary | ICD-10-CM | POA: Diagnosis not present

## 2017-10-02 DIAGNOSIS — J301 Allergic rhinitis due to pollen: Secondary | ICD-10-CM | POA: Diagnosis not present

## 2017-10-07 DIAGNOSIS — J3081 Allergic rhinitis due to animal (cat) (dog) hair and dander: Secondary | ICD-10-CM | POA: Diagnosis not present

## 2017-10-07 DIAGNOSIS — J3089 Other allergic rhinitis: Secondary | ICD-10-CM | POA: Diagnosis not present

## 2017-10-07 DIAGNOSIS — J301 Allergic rhinitis due to pollen: Secondary | ICD-10-CM | POA: Diagnosis not present

## 2017-10-14 DIAGNOSIS — J3081 Allergic rhinitis due to animal (cat) (dog) hair and dander: Secondary | ICD-10-CM | POA: Diagnosis not present

## 2017-10-14 DIAGNOSIS — J3089 Other allergic rhinitis: Secondary | ICD-10-CM | POA: Diagnosis not present

## 2017-10-14 DIAGNOSIS — J301 Allergic rhinitis due to pollen: Secondary | ICD-10-CM | POA: Diagnosis not present

## 2017-10-23 DIAGNOSIS — J3081 Allergic rhinitis due to animal (cat) (dog) hair and dander: Secondary | ICD-10-CM | POA: Diagnosis not present

## 2017-10-23 DIAGNOSIS — J3089 Other allergic rhinitis: Secondary | ICD-10-CM | POA: Diagnosis not present

## 2017-10-23 DIAGNOSIS — J301 Allergic rhinitis due to pollen: Secondary | ICD-10-CM | POA: Diagnosis not present

## 2017-10-27 DIAGNOSIS — G4733 Obstructive sleep apnea (adult) (pediatric): Secondary | ICD-10-CM | POA: Diagnosis not present

## 2017-10-28 DIAGNOSIS — J301 Allergic rhinitis due to pollen: Secondary | ICD-10-CM | POA: Diagnosis not present

## 2017-10-28 DIAGNOSIS — J3089 Other allergic rhinitis: Secondary | ICD-10-CM | POA: Diagnosis not present

## 2017-10-28 DIAGNOSIS — J3081 Allergic rhinitis due to animal (cat) (dog) hair and dander: Secondary | ICD-10-CM | POA: Diagnosis not present

## 2017-11-05 DIAGNOSIS — J3081 Allergic rhinitis due to animal (cat) (dog) hair and dander: Secondary | ICD-10-CM | POA: Diagnosis not present

## 2017-11-05 DIAGNOSIS — J301 Allergic rhinitis due to pollen: Secondary | ICD-10-CM | POA: Diagnosis not present

## 2017-11-05 DIAGNOSIS — J3089 Other allergic rhinitis: Secondary | ICD-10-CM | POA: Diagnosis not present

## 2017-11-12 DIAGNOSIS — J3089 Other allergic rhinitis: Secondary | ICD-10-CM | POA: Diagnosis not present

## 2017-11-12 DIAGNOSIS — J301 Allergic rhinitis due to pollen: Secondary | ICD-10-CM | POA: Diagnosis not present

## 2017-11-12 DIAGNOSIS — J3081 Allergic rhinitis due to animal (cat) (dog) hair and dander: Secondary | ICD-10-CM | POA: Diagnosis not present

## 2017-11-19 DIAGNOSIS — J301 Allergic rhinitis due to pollen: Secondary | ICD-10-CM | POA: Diagnosis not present

## 2017-11-19 DIAGNOSIS — J3089 Other allergic rhinitis: Secondary | ICD-10-CM | POA: Diagnosis not present

## 2017-11-19 DIAGNOSIS — J3081 Allergic rhinitis due to animal (cat) (dog) hair and dander: Secondary | ICD-10-CM | POA: Diagnosis not present

## 2017-11-26 DIAGNOSIS — L821 Other seborrheic keratosis: Secondary | ICD-10-CM | POA: Diagnosis not present

## 2017-11-26 DIAGNOSIS — L814 Other melanin hyperpigmentation: Secondary | ICD-10-CM | POA: Diagnosis not present

## 2017-11-26 DIAGNOSIS — D1800 Hemangioma unspecified site: Secondary | ICD-10-CM | POA: Diagnosis not present

## 2017-11-26 DIAGNOSIS — L820 Inflamed seborrheic keratosis: Secondary | ICD-10-CM | POA: Diagnosis not present

## 2017-11-26 DIAGNOSIS — D485 Neoplasm of uncertain behavior of skin: Secondary | ICD-10-CM | POA: Diagnosis not present

## 2017-11-26 DIAGNOSIS — L570 Actinic keratosis: Secondary | ICD-10-CM | POA: Diagnosis not present

## 2017-11-27 DIAGNOSIS — J301 Allergic rhinitis due to pollen: Secondary | ICD-10-CM | POA: Diagnosis not present

## 2017-11-27 DIAGNOSIS — J3081 Allergic rhinitis due to animal (cat) (dog) hair and dander: Secondary | ICD-10-CM | POA: Diagnosis not present

## 2017-11-27 DIAGNOSIS — J3089 Other allergic rhinitis: Secondary | ICD-10-CM | POA: Diagnosis not present

## 2017-12-09 DIAGNOSIS — J3081 Allergic rhinitis due to animal (cat) (dog) hair and dander: Secondary | ICD-10-CM | POA: Diagnosis not present

## 2017-12-09 DIAGNOSIS — J301 Allergic rhinitis due to pollen: Secondary | ICD-10-CM | POA: Diagnosis not present

## 2017-12-09 DIAGNOSIS — J3089 Other allergic rhinitis: Secondary | ICD-10-CM | POA: Diagnosis not present

## 2017-12-16 DIAGNOSIS — J301 Allergic rhinitis due to pollen: Secondary | ICD-10-CM | POA: Diagnosis not present

## 2017-12-16 DIAGNOSIS — J3089 Other allergic rhinitis: Secondary | ICD-10-CM | POA: Diagnosis not present

## 2017-12-16 DIAGNOSIS — J3081 Allergic rhinitis due to animal (cat) (dog) hair and dander: Secondary | ICD-10-CM | POA: Diagnosis not present

## 2017-12-17 DIAGNOSIS — E785 Hyperlipidemia, unspecified: Secondary | ICD-10-CM | POA: Diagnosis not present

## 2017-12-17 DIAGNOSIS — K219 Gastro-esophageal reflux disease without esophagitis: Secondary | ICD-10-CM | POA: Diagnosis not present

## 2017-12-17 DIAGNOSIS — E559 Vitamin D deficiency, unspecified: Secondary | ICD-10-CM | POA: Diagnosis not present

## 2017-12-24 DIAGNOSIS — J3089 Other allergic rhinitis: Secondary | ICD-10-CM | POA: Diagnosis not present

## 2017-12-24 DIAGNOSIS — J301 Allergic rhinitis due to pollen: Secondary | ICD-10-CM | POA: Diagnosis not present

## 2017-12-24 DIAGNOSIS — J3081 Allergic rhinitis due to animal (cat) (dog) hair and dander: Secondary | ICD-10-CM | POA: Diagnosis not present

## 2017-12-31 DIAGNOSIS — J301 Allergic rhinitis due to pollen: Secondary | ICD-10-CM | POA: Diagnosis not present

## 2017-12-31 DIAGNOSIS — J3081 Allergic rhinitis due to animal (cat) (dog) hair and dander: Secondary | ICD-10-CM | POA: Diagnosis not present

## 2017-12-31 DIAGNOSIS — J3089 Other allergic rhinitis: Secondary | ICD-10-CM | POA: Diagnosis not present

## 2018-01-04 DIAGNOSIS — G4733 Obstructive sleep apnea (adult) (pediatric): Secondary | ICD-10-CM | POA: Diagnosis not present

## 2018-01-07 DIAGNOSIS — J301 Allergic rhinitis due to pollen: Secondary | ICD-10-CM | POA: Diagnosis not present

## 2018-01-07 DIAGNOSIS — J3089 Other allergic rhinitis: Secondary | ICD-10-CM | POA: Diagnosis not present

## 2018-01-07 DIAGNOSIS — J3081 Allergic rhinitis due to animal (cat) (dog) hair and dander: Secondary | ICD-10-CM | POA: Diagnosis not present

## 2018-01-13 DIAGNOSIS — J3081 Allergic rhinitis due to animal (cat) (dog) hair and dander: Secondary | ICD-10-CM | POA: Diagnosis not present

## 2018-01-13 DIAGNOSIS — J3089 Other allergic rhinitis: Secondary | ICD-10-CM | POA: Diagnosis not present

## 2018-01-13 DIAGNOSIS — J301 Allergic rhinitis due to pollen: Secondary | ICD-10-CM | POA: Diagnosis not present

## 2018-01-22 DIAGNOSIS — J301 Allergic rhinitis due to pollen: Secondary | ICD-10-CM | POA: Diagnosis not present

## 2018-01-22 DIAGNOSIS — J3089 Other allergic rhinitis: Secondary | ICD-10-CM | POA: Diagnosis not present

## 2018-01-22 DIAGNOSIS — H1045 Other chronic allergic conjunctivitis: Secondary | ICD-10-CM | POA: Diagnosis not present

## 2018-01-27 DIAGNOSIS — J3089 Other allergic rhinitis: Secondary | ICD-10-CM | POA: Diagnosis not present

## 2018-01-27 DIAGNOSIS — J301 Allergic rhinitis due to pollen: Secondary | ICD-10-CM | POA: Diagnosis not present

## 2018-01-27 DIAGNOSIS — J3081 Allergic rhinitis due to animal (cat) (dog) hair and dander: Secondary | ICD-10-CM | POA: Diagnosis not present

## 2018-02-02 DIAGNOSIS — J029 Acute pharyngitis, unspecified: Secondary | ICD-10-CM | POA: Diagnosis not present

## 2018-02-11 DIAGNOSIS — J3089 Other allergic rhinitis: Secondary | ICD-10-CM | POA: Diagnosis not present

## 2018-02-11 DIAGNOSIS — J301 Allergic rhinitis due to pollen: Secondary | ICD-10-CM | POA: Diagnosis not present

## 2018-02-11 DIAGNOSIS — J3081 Allergic rhinitis due to animal (cat) (dog) hair and dander: Secondary | ICD-10-CM | POA: Diagnosis not present

## 2018-02-18 DIAGNOSIS — J3081 Allergic rhinitis due to animal (cat) (dog) hair and dander: Secondary | ICD-10-CM | POA: Diagnosis not present

## 2018-02-18 DIAGNOSIS — J3089 Other allergic rhinitis: Secondary | ICD-10-CM | POA: Diagnosis not present

## 2018-02-18 DIAGNOSIS — J301 Allergic rhinitis due to pollen: Secondary | ICD-10-CM | POA: Diagnosis not present

## 2018-02-26 DIAGNOSIS — J301 Allergic rhinitis due to pollen: Secondary | ICD-10-CM | POA: Diagnosis not present

## 2018-02-26 DIAGNOSIS — J3081 Allergic rhinitis due to animal (cat) (dog) hair and dander: Secondary | ICD-10-CM | POA: Diagnosis not present

## 2018-02-26 DIAGNOSIS — J3089 Other allergic rhinitis: Secondary | ICD-10-CM | POA: Diagnosis not present

## 2018-03-05 DIAGNOSIS — J301 Allergic rhinitis due to pollen: Secondary | ICD-10-CM | POA: Diagnosis not present

## 2018-03-05 DIAGNOSIS — J3089 Other allergic rhinitis: Secondary | ICD-10-CM | POA: Diagnosis not present

## 2018-03-05 DIAGNOSIS — J3081 Allergic rhinitis due to animal (cat) (dog) hair and dander: Secondary | ICD-10-CM | POA: Diagnosis not present

## 2018-03-11 DIAGNOSIS — J3081 Allergic rhinitis due to animal (cat) (dog) hair and dander: Secondary | ICD-10-CM | POA: Diagnosis not present

## 2018-03-11 DIAGNOSIS — J301 Allergic rhinitis due to pollen: Secondary | ICD-10-CM | POA: Diagnosis not present

## 2018-03-11 DIAGNOSIS — J3089 Other allergic rhinitis: Secondary | ICD-10-CM | POA: Diagnosis not present

## 2018-03-18 DIAGNOSIS — J301 Allergic rhinitis due to pollen: Secondary | ICD-10-CM | POA: Diagnosis not present

## 2018-03-18 DIAGNOSIS — J3089 Other allergic rhinitis: Secondary | ICD-10-CM | POA: Diagnosis not present

## 2018-03-18 DIAGNOSIS — J3081 Allergic rhinitis due to animal (cat) (dog) hair and dander: Secondary | ICD-10-CM | POA: Diagnosis not present

## 2018-03-18 IMAGING — US US SCROTUM
1 series · 14 of 25 positions shown · non-contrast
Comparison: None.

CLINICAL DATA: Right testicular lump.

EXAM:
ULTRASOUND OF SCROTUM
TECHNIQUE: Complete ultrasound examination of the testicles, epididymis, and
other scrotal structures was performed.

[Series 1: us scrotum · 0.06mm/px · 14 of 61 slices shown]
[im 1/61]
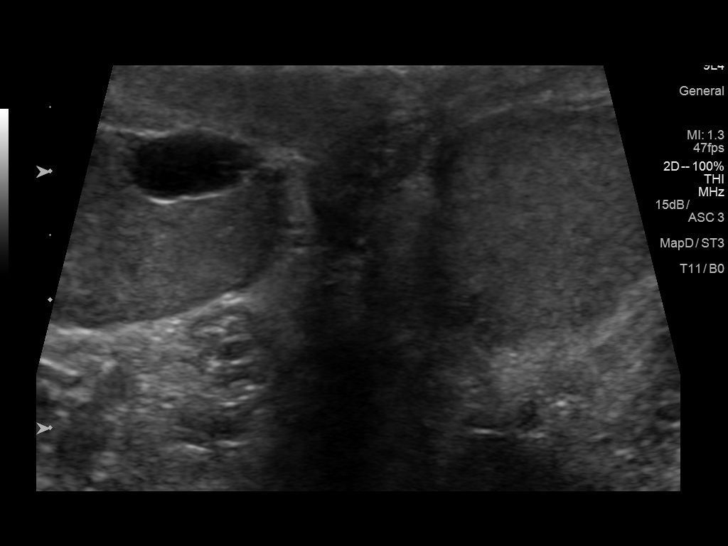
[im 6/61]
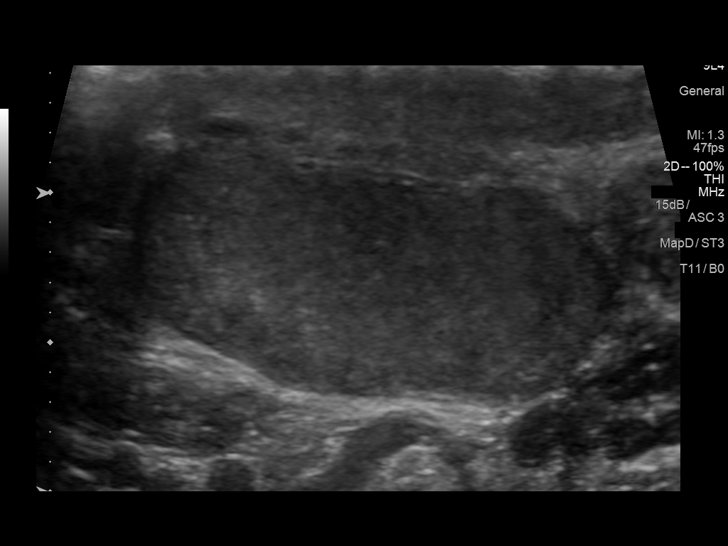
[im 11/61]
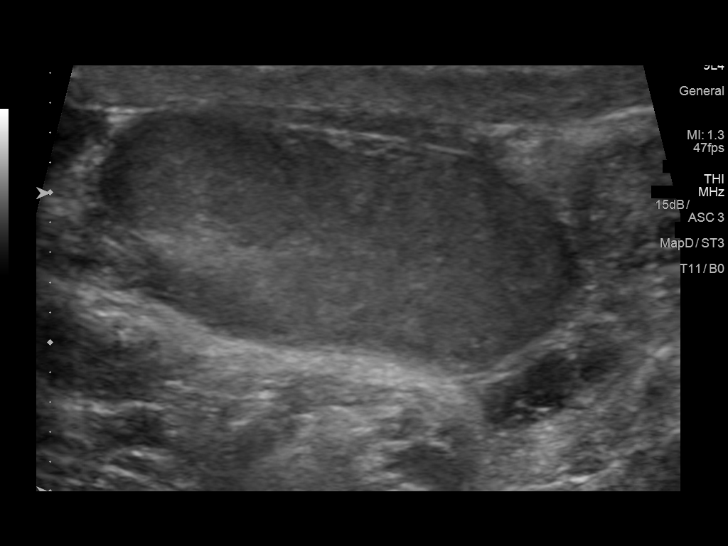
[im 16/61]
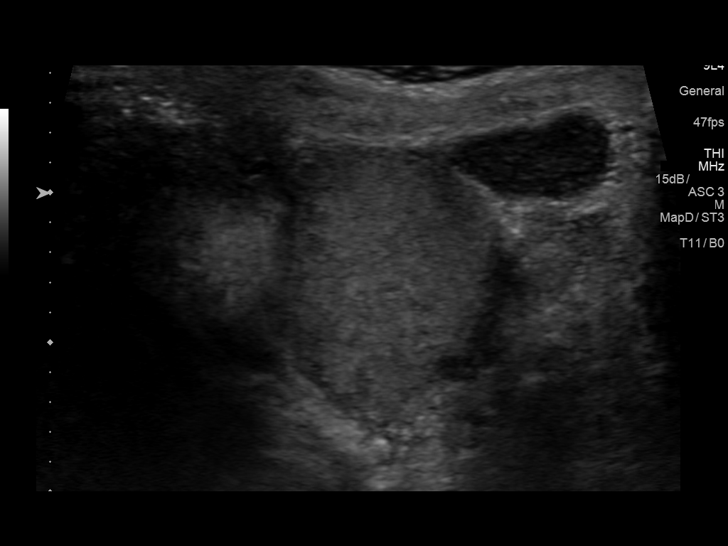
[im 21/61]
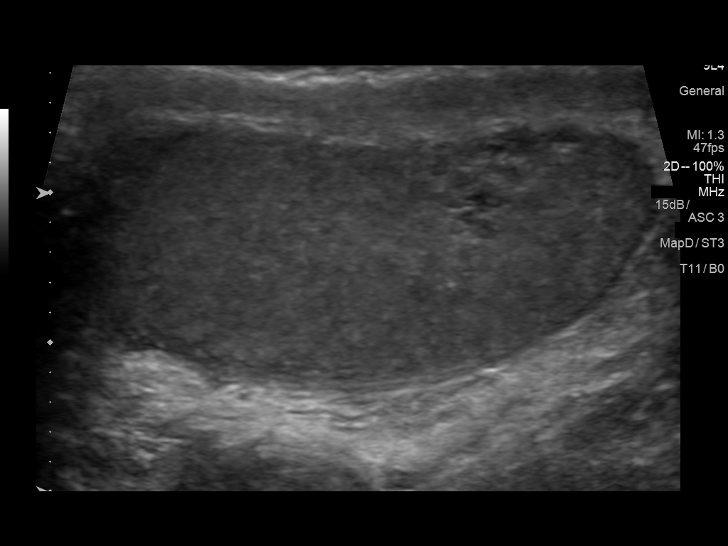
[im 23/61]
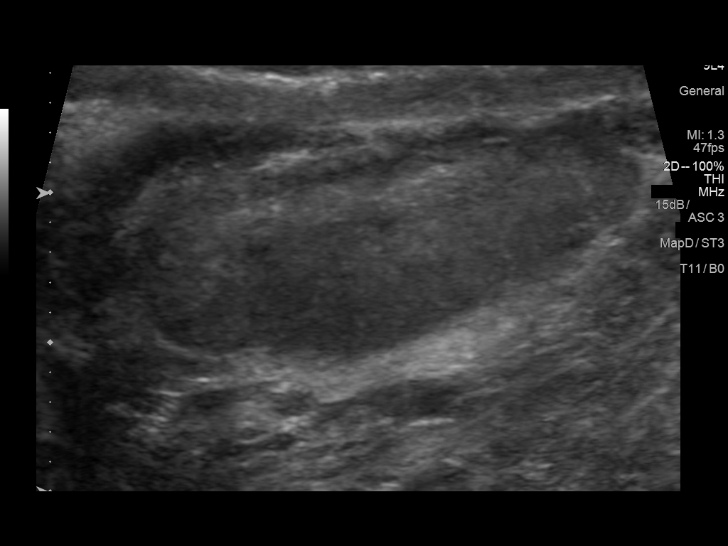
[im 28/61]
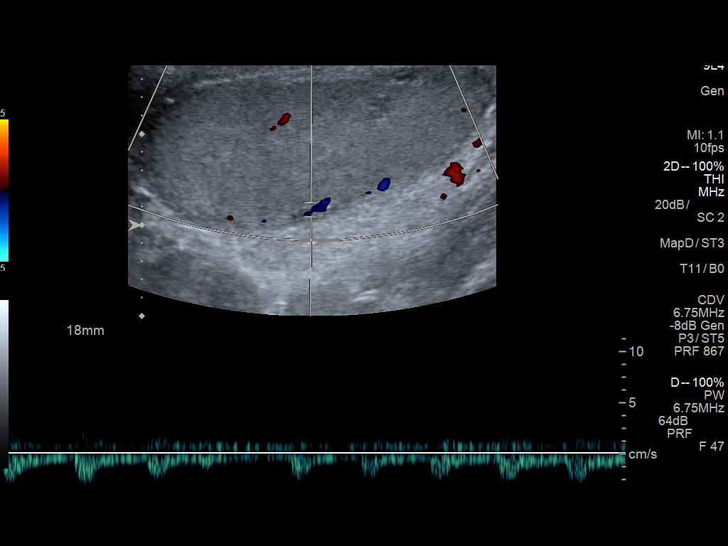
[im 33/61]
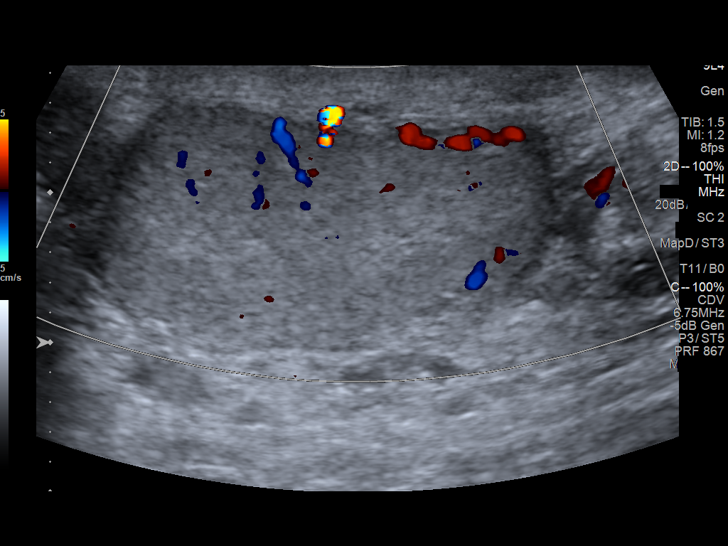
[im 38/61]
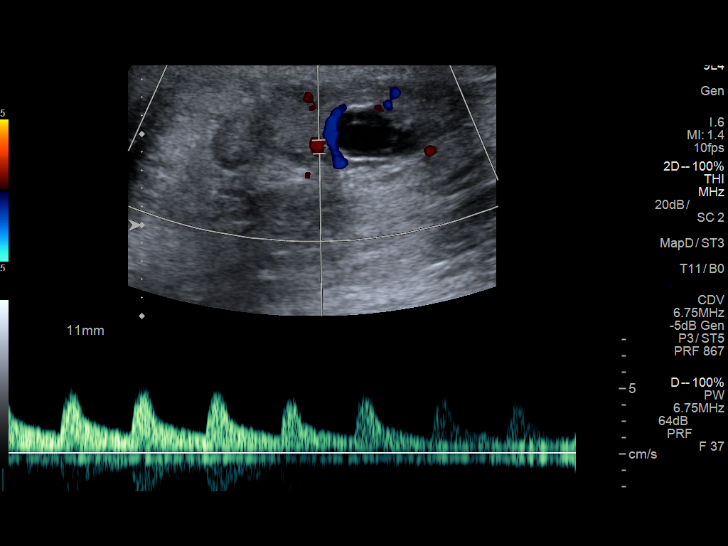
[im 41/61]
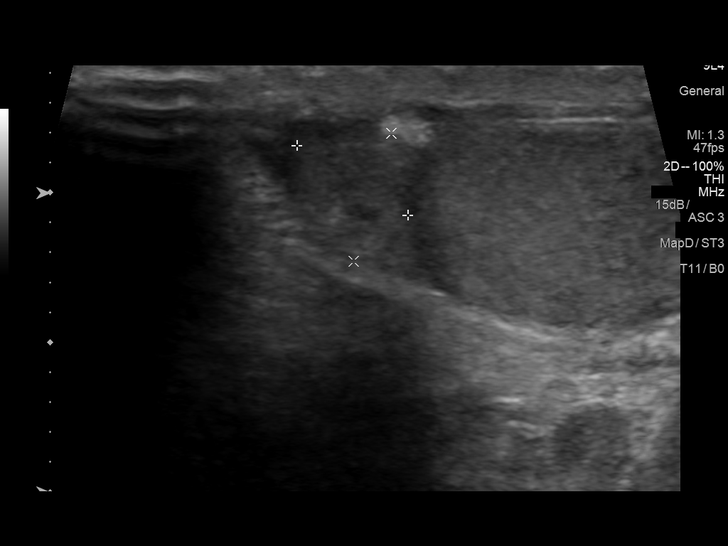
[im 46/61]
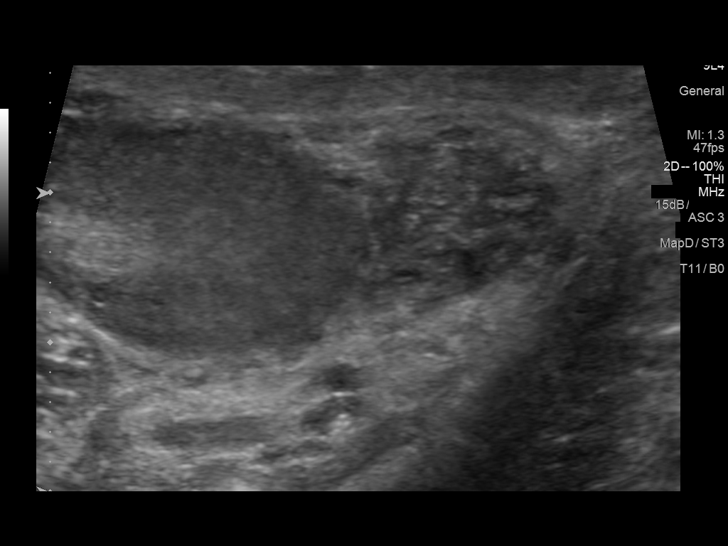
[im 51/61]
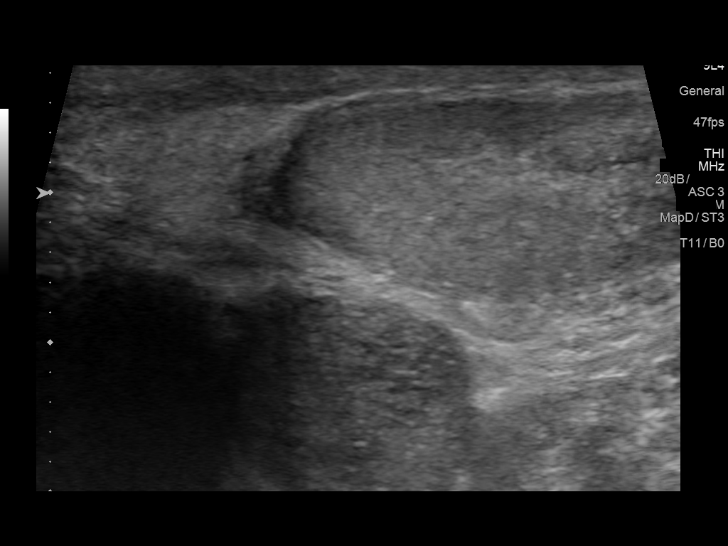
[im 56/61]
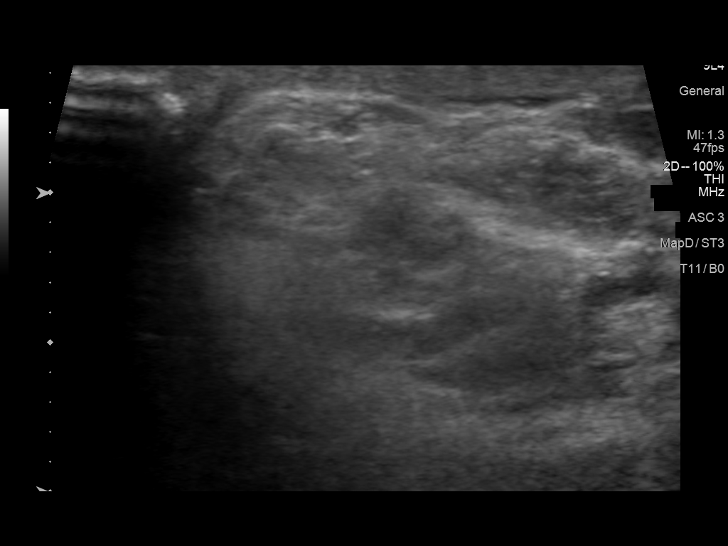
[im 61/61]
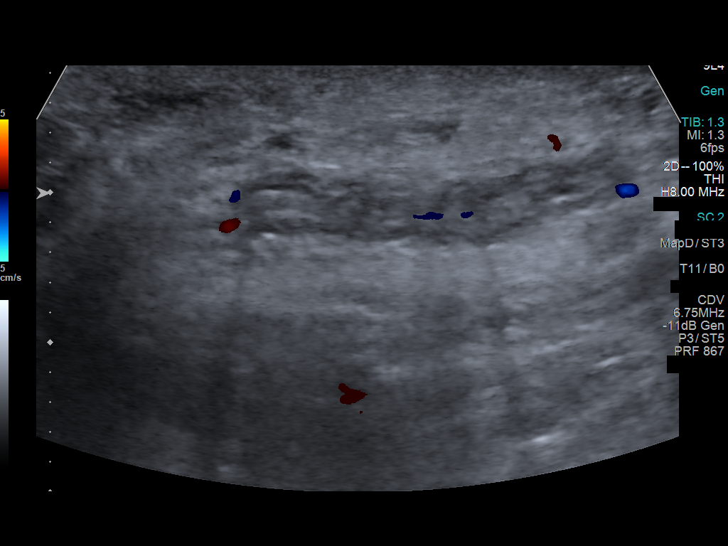

[14 of 25 positions shown; findings below may reference images not displayed]

FINDINGS: Right testicle

Measurements: 3.2 x 1.5 x 2.1 cm. Testicular cyst, avascular,
measuring 8 x 5 mm. No suspicious mass or lesion within the
testicle.

Left testicle

Measurements: 3.6 x 1.6 x 2.5 cm. No mass or microlithiasis
visualized.

Right epididymis:  Normal in size and appearance.

Left epididymis:  Normal in size and appearance.

Hydrocele:  None visualized.

Varicocele:  None visualized.
IMPRESSION: 1. Right testicular cyst, measuring 8 x 5 mm, benign appearing, a
likely correlate for the testicular lump. Recommend clinical
follow-up and follow-up ultrasound if palpable lump enlarges or
increases in extent.
2. Left testicle appears normal.
3. Bilateral epididymi appear normal.
4. No hydrocele or varicocele demonstrated.

## 2018-04-01 DIAGNOSIS — J3089 Other allergic rhinitis: Secondary | ICD-10-CM | POA: Diagnosis not present

## 2018-04-01 DIAGNOSIS — J301 Allergic rhinitis due to pollen: Secondary | ICD-10-CM | POA: Diagnosis not present

## 2018-04-01 DIAGNOSIS — J3081 Allergic rhinitis due to animal (cat) (dog) hair and dander: Secondary | ICD-10-CM | POA: Diagnosis not present

## 2018-04-08 DIAGNOSIS — J3089 Other allergic rhinitis: Secondary | ICD-10-CM | POA: Diagnosis not present

## 2018-04-08 DIAGNOSIS — J3081 Allergic rhinitis due to animal (cat) (dog) hair and dander: Secondary | ICD-10-CM | POA: Diagnosis not present

## 2018-04-08 DIAGNOSIS — J301 Allergic rhinitis due to pollen: Secondary | ICD-10-CM | POA: Diagnosis not present

## 2018-04-09 ENCOUNTER — Ambulatory Visit (INDEPENDENT_AMBULATORY_CARE_PROVIDER_SITE_OTHER): Payer: 59

## 2018-04-09 ENCOUNTER — Ambulatory Visit (INDEPENDENT_AMBULATORY_CARE_PROVIDER_SITE_OTHER): Payer: 59 | Admitting: Podiatry

## 2018-04-09 ENCOUNTER — Encounter: Payer: Self-pay | Admitting: Podiatry

## 2018-04-09 VITALS — BP 125/84 | HR 67 | Resp 16 | Ht 70.0 in | Wt 260.0 lb

## 2018-04-09 DIAGNOSIS — M7989 Other specified soft tissue disorders: Secondary | ICD-10-CM

## 2018-04-09 DIAGNOSIS — B351 Tinea unguium: Secondary | ICD-10-CM | POA: Diagnosis not present

## 2018-04-09 DIAGNOSIS — M799 Soft tissue disorder, unspecified: Secondary | ICD-10-CM

## 2018-04-09 DIAGNOSIS — M79675 Pain in left toe(s): Secondary | ICD-10-CM | POA: Diagnosis not present

## 2018-04-09 DIAGNOSIS — M216X9 Other acquired deformities of unspecified foot: Secondary | ICD-10-CM | POA: Diagnosis not present

## 2018-04-09 DIAGNOSIS — M722 Plantar fascial fibromatosis: Secondary | ICD-10-CM | POA: Diagnosis not present

## 2018-04-09 NOTE — Progress Notes (Signed)
   Subjective:    Patient ID: Aaron Carpenter, male    DOB: June 07, 1966, 52 y.o.   MRN: [id number]  HPI 52 year old male presents the office today for numerous concerns.  His main concern is his left big toe.  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  Because of some intermittent discomfort but denies any redness or any skin changes over the area.  He said no recent treatment or evaluation of this.  Secondly he does have nail fungus on both of his great toes and he would like to discuss treatment options for this.  No pain in the toenails no redness or drainage or any swelling.  Lastly due to his high arch he is requesting custom orthotics today.  He states when he stands for long period of time he does get some pain to the arch of the foot.  No recent injury to his feet no swelling otherwise he has no other concerns today.   Review of Systems  HENT: Positive for hearing loss, sinus pain and tinnitus.   Eyes: Positive for itching.  Gastrointestinal: Positive for abdominal distention.  Allergic/Immunologic: Positive for environmental allergies and food allergies.  All other systems reviewed and are negative.      Objective:   Physical Exam General: AAO x3, NAD  Dermatological: Bilateral hallux toenails are hypertrophic, dystrophic with ill-defined discoloration.  There is no pain in the nails there is no surrounding redness or drainage or any signs of infection.  Vascular: Dorsalis Pedis artery and Posterior Tibial artery pedal pulses are 2/4 bilateral with immedate capillary fill time.  There is no pain with calf compression, swelling, warmth, erythema.   Neruologic: Grossly intact via light touch bilateral. Protective threshold with Semmes Wienstein monofilament intact to all pedal sites bilateral.   Musculoskeletal: On the plantar aspect the left hallux there is a soft tissue mass present in the toe and appears to  be almost a ganglion type cyst is mobile and it appears to be fluid-filled.  There is no overlying skin change.  There is a cavus foot type present.  Subjectively he gets some pain on the medial band plantar fascia the arch of the foot but currently not expensing any pain.  Overall tendons appear to be intact.  MMT 5/5.  Range of motion intact.  Gait: Unassisted, Nonantalgic.      Assessment & Plan:  52 year old male with soft tissue mass left hallux, onychomycosis, cavus foot type with plantar fasciitis. -Treatment options discussed including all alternatives, risks, and complications -Etiology of symptoms were discussed -X-rays were obtained and reviewed with the patient.  There is no evidence of acute fracture or stress reaction is no bony destruction present.  No calcifications present. -Regards to left hallux soft tissue mass due to his previous history of the glomus tumor excision I do recommend an MRI to evaluate the soft tissue mass. -For the nail fungus we discussed options.  After discussion with her and start with topical treatment.  We will do this today through [employer].  Discussed with transferring symptoms no improvement in the oral medication. -He wants to proceed with orthotics.  This is reasonable given his arch pain which likely plantar fasciitis.  He was noted today by Juliann Pulse and paperwork was completed today for this. Will discuss with Rick.   Return in about 3 weeks (around 04/30/2018).  Trula Slade DPM

## 2018-04-10 DIAGNOSIS — M722 Plantar fascial fibromatosis: Secondary | ICD-10-CM | POA: Insufficient documentation

## 2018-04-10 DIAGNOSIS — B351 Tinea unguium: Secondary | ICD-10-CM | POA: Insufficient documentation

## 2018-04-12 NOTE — Addendum Note (Signed)
Addended by: Harriett Sine D on: 04/12/2018 09:38 AM   Modules accepted: Orders

## 2018-04-16 ENCOUNTER — Telehealth: Payer: Self-pay | Admitting: *Deleted

## 2018-04-16 DIAGNOSIS — M7989 Other specified soft tissue disorders: Secondary | ICD-10-CM

## 2018-04-16 DIAGNOSIS — J3081 Allergic rhinitis due to animal (cat) (dog) hair and dander: Secondary | ICD-10-CM | POA: Diagnosis not present

## 2018-04-16 DIAGNOSIS — J3089 Other allergic rhinitis: Secondary | ICD-10-CM | POA: Diagnosis not present

## 2018-04-16 DIAGNOSIS — J301 Allergic rhinitis due to pollen: Secondary | ICD-10-CM | POA: Diagnosis not present

## 2018-04-16 NOTE — Telephone Encounter (Signed)
[facility] states they do not have MRI for toes, order as foot with and without, note with attention to the toes. Faxed orders to [facility].

## 2018-04-22 DIAGNOSIS — J3089 Other allergic rhinitis: Secondary | ICD-10-CM | POA: Diagnosis not present

## 2018-04-22 DIAGNOSIS — J301 Allergic rhinitis due to pollen: Secondary | ICD-10-CM | POA: Diagnosis not present

## 2018-04-25 ENCOUNTER — Other Ambulatory Visit: Payer: 59

## 2018-04-25 ENCOUNTER — Ambulatory Visit
Admission: RE | Admit: 2018-04-25 | Discharge: 2018-04-25 | Disposition: A | Discharge status: Home / Self Care | Payer: 59 | Source: Ambulatory Visit | Attending: Podiatry | Admitting: Podiatry

## 2018-04-25 DIAGNOSIS — M7989 Other specified soft tissue disorders: Secondary | ICD-10-CM

## 2018-04-25 DIAGNOSIS — M799 Soft tissue disorder, unspecified: Secondary | ICD-10-CM | POA: Diagnosis not present

## 2018-04-25 MED ORDER — GADOBENATE DIMEGLUMINE 529 MG/ML IV SOLN
20.0000 mL | Freq: Once | INTRAVENOUS | Status: AC | PRN
  Administered 2018-04-25: 20 mL via INTRAVENOUS

## 2018-04-27 DIAGNOSIS — G4733 Obstructive sleep apnea (adult) (pediatric): Secondary | ICD-10-CM | POA: Diagnosis not present

## 2018-04-29 ENCOUNTER — Telehealth: Payer: Self-pay | Admitting: *Deleted

## 2018-04-29 DIAGNOSIS — J3081 Allergic rhinitis due to animal (cat) (dog) hair and dander: Secondary | ICD-10-CM | POA: Diagnosis not present

## 2018-04-29 DIAGNOSIS — J301 Allergic rhinitis due to pollen: Secondary | ICD-10-CM | POA: Diagnosis not present

## 2018-04-29 DIAGNOSIS — J3089 Other allergic rhinitis: Secondary | ICD-10-CM | POA: Diagnosis not present

## 2018-04-29 NOTE — Telephone Encounter (Signed)
I informed pt of Dr. Leigh Aurora review of MRI results and orders. Pt states he has an appt tomorrow 8:15am.

## 2018-04-29 NOTE — Telephone Encounter (Signed)
-----   Message from Trula Slade, DPM sent at 04/28/2018  2:29 PM EDT ----- Val- please let her know that the MRI does show a cyst. Can schedule a follow-up to further discuss options.

## 2018-04-30 ENCOUNTER — Ambulatory Visit (INDEPENDENT_AMBULATORY_CARE_PROVIDER_SITE_OTHER): Payer: 59 | Admitting: Podiatry

## 2018-04-30 DIAGNOSIS — M799 Soft tissue disorder, unspecified: Secondary | ICD-10-CM

## 2018-04-30 DIAGNOSIS — M7989 Other specified soft tissue disorders: Secondary | ICD-10-CM

## 2018-04-30 DIAGNOSIS — M722 Plantar fascial fibromatosis: Secondary | ICD-10-CM

## 2018-04-30 NOTE — Patient Instructions (Addendum)

## 2018-05-03 ENCOUNTER — Ambulatory Visit: Payer: 59 | Admitting: Orthotics

## 2018-05-03 DIAGNOSIS — M7989 Other specified soft tissue disorders: Secondary | ICD-10-CM

## 2018-05-03 DIAGNOSIS — M722 Plantar fascial fibromatosis: Secondary | ICD-10-CM

## 2018-05-03 NOTE — Progress Notes (Signed)
Change topcover to vinyl.  Change f/o to 3/4. Remove lift.

## 2018-05-03 NOTE — Progress Notes (Signed)
Subjective: 52 year old male presents the office today for follow-up evaluation of left hallux soft tissue mass as well as pick up orthotics.  He presents to discuss MRI results of left foot.  Overall he states that the area is unchanged he has no new concerns today. Denies any systemic complaints such as fevers, chills, nausea, vomiting. No acute changes since last appointment, and no other complaints at this time.   Objective: AAO x3, NAD DP/PT pulses palpable bilaterally, CRT less than 3 seconds Fluid-filled appearing mobile soft tissue mass is present on the left hallux most of the plantar and plantar lateral aspect.  There is no overlying erythema or skin discoloration.  He states the area started become more painful and is painful about 75% the time.  He does relate some nerve type symptoms in the toe at times.  Overall clinically appears to be unchanged otherwise.  No open lesions or pre-ulcerative lesions.  No pain with calf compression, swelling, warmth, erythema  Assessment: Soft tissue mass left  Plan: -All treatment options discussed with the patient including all alternatives, risks, complications.  -I reviewed the MRI results with him.  Discussed will try to aspirate the area and he wished to proceed understanding risks and complications and alternatives.  I anesthetized with a mixture of 3 cc of lidocaine and Marcaine plain.  Once anesthetized the skin was prepped with Betadine and 18-gauge needle was introduced into the soft tissue mass however I was unable to aspirate any fluid.  After 2 attempts and no longer proceeded.  Compression bandage was applied.  Because of this I discussed with him surgical excision and we discussed the procedure as well as postoperative course.  Understand this he wished to proceed with surgical excision of soft tissue mass left hallux understands the risk of vascular compromise, recurrence of the lesion among other general risks of surgery included  below. -The incision placement as well as the postoperative course was discussed with the patient. I discussed risks of the surgery which include, but not limited to, infection, bleeding, pain, swelling, need for further surgery, delayed or nonhealing, painful or ugly scar, numbness or sensation changes, over/under correction, recurrence, transfer lesions, further deformity, hardware failure, DVT/PE, loss of toe/foot. Patient understands these risks and wishes to proceed with surgery. The surgical consent was reviewed with the patient all 3 pages were signed. No promises or guarantees were given to the outcome of the procedure. All questions were answered to the best of my ability. Before the surgery the patient was encouraged to call the office if there is any further questions. The surgery will be performed at the [facility] [place] on an outpatient basis. -Short cam boot was dispensed for postoperative use. -Patient encouraged to call the office with any questions, concerns, change in symptoms.   Trula Slade DPM

## 2018-05-06 DIAGNOSIS — J301 Allergic rhinitis due to pollen: Secondary | ICD-10-CM | POA: Diagnosis not present

## 2018-05-06 DIAGNOSIS — J3081 Allergic rhinitis due to animal (cat) (dog) hair and dander: Secondary | ICD-10-CM | POA: Diagnosis not present

## 2018-05-06 DIAGNOSIS — J3089 Other allergic rhinitis: Secondary | ICD-10-CM | POA: Diagnosis not present

## 2018-05-13 DIAGNOSIS — J3081 Allergic rhinitis due to animal (cat) (dog) hair and dander: Secondary | ICD-10-CM | POA: Diagnosis not present

## 2018-05-13 DIAGNOSIS — J301 Allergic rhinitis due to pollen: Secondary | ICD-10-CM | POA: Diagnosis not present

## 2018-05-13 DIAGNOSIS — J3089 Other allergic rhinitis: Secondary | ICD-10-CM | POA: Diagnosis not present

## 2018-05-18 ENCOUNTER — Ambulatory Visit: Payer: 59 | Admitting: Orthotics

## 2018-05-18 DIAGNOSIS — M722 Plantar fascial fibromatosis: Secondary | ICD-10-CM

## 2018-05-19 ENCOUNTER — Other Ambulatory Visit: Payer: Self-pay | Admitting: Podiatry

## 2018-05-19 ENCOUNTER — Encounter: Payer: Self-pay | Admitting: Podiatry

## 2018-05-19 DIAGNOSIS — M799 Soft tissue disorder, unspecified: Secondary | ICD-10-CM | POA: Diagnosis not present

## 2018-05-19 DIAGNOSIS — D2122 Benign neoplasm of connective and other soft tissue of left lower limb, including hip: Secondary | ICD-10-CM | POA: Diagnosis not present

## 2018-05-19 MED ORDER — CEPHALEXIN 500 MG PO CAPS: 500.0000 mg | ORAL_CAPSULE | Freq: Three times a day (TID) | ORAL | 0 refills | Status: DC

## 2018-05-19 MED ORDER — OXYCODONE-ACETAMINOPHEN 5-325 MG PO TABS: 1.0000 | ORAL_TABLET | Freq: Four times a day (QID) | ORAL | 0 refills | Status: DC | PRN

## 2018-05-19 MED ORDER — PROMETHAZINE HCL 25 MG PO TABS: 25.0000 mg | ORAL_TABLET | Freq: Three times a day (TID) | ORAL | 0 refills | Status: DC | PRN

## 2018-05-19 NOTE — Progress Notes (Signed)
Post op medications sent to pharmacy 

## 2018-05-22 ENCOUNTER — Telehealth: Payer: Self-pay | Admitting: *Deleted

## 2018-05-22 NOTE — Telephone Encounter (Signed)
Called patient on Thursday and stated that I was calling to check on the patient and the patient stated that the bandage was a little tight and patient's wife cut some of the bandage off and that the crutches that was given at the surgical center was too tall and could not use and patient stated he had only taken one pain medicine and there was no fever or chills and no nausea and I stated that I would speak with Dr Jacqualyn Posey about the crutches and call patient back and if there was any concerns or questions to call the office at [phone] and I called the patient back and stated per Dr Jacqualyn Posey the crutches were to really keep pressure off of the surgery site. Lattie Haw

## 2018-05-24 ENCOUNTER — Telehealth: Payer: Self-pay | Admitting: *Deleted

## 2018-05-24 ENCOUNTER — Encounter: Payer: Self-pay | Admitting: Podiatry

## 2018-05-24 ENCOUNTER — Ambulatory Visit (INDEPENDENT_AMBULATORY_CARE_PROVIDER_SITE_OTHER): Payer: 59 | Admitting: Podiatry

## 2018-05-24 DIAGNOSIS — M7989 Other specified soft tissue disorders: Secondary | ICD-10-CM

## 2018-05-24 DIAGNOSIS — Z9889 Other specified postprocedural states: Secondary | ICD-10-CM

## 2018-05-24 NOTE — Telephone Encounter (Signed)
I called and spoke to the pathologist. They are going to review the case but the results may be delayed.

## 2018-05-24 NOTE — Telephone Encounter (Signed)
"  I want to get a message to Dr. Jacqualyn Posey about a biopsy on his patient, last name Martinique and his date of birth is Oct 12, 1965.  He can reach me at [phone].

## 2018-05-26 ENCOUNTER — Encounter: Payer: Self-pay | Admitting: Podiatry

## 2018-05-26 NOTE — Progress Notes (Signed)
Subjective: Aaron Carpenter is a 52 y.o. is seen today in office s/p left foot soft tissue mass excision preformed on 05-19-2018.  He states that overall he is doing well. Denies any systemic complaints such as fevers, chills, nausea, vomiting. No calf pain, chest pain, shortness of breath.   Objective: General: No acute distress, AAOx3  DP/PT pulses palpable 2/4, CRT < 3 sec to all digits.  Protective sensation intact. Motor function intact.  Left foot: Incision is well coapted without any evidence of dehiscence and sutures are intact. There is no surrounding erythema, ascending cellulitis, fluctuance, crepitus, malodor, drainage/purulence. There is minimal edema around the surgical site. There is mild pain along the surgical site.  No other areas of tenderness to bilateral lower extremities.  No other open lesions or pre-ulcerative lesions.  No pain with calf compression, swelling, warmth, erythema.   Assessment and Plan:  Status post left foot soft tissue mass excision, doing well with no complications   -Treatment options discussed including all alternatives, risks, and complications -Incision appears to be healing well.  Antibiotic ointment and a bandage was applied.  Keep the dressing clean, dry, intact. -Remain weightbearing cam boot. -Ice/elevation -Pain medication as needed. -Monitor for any clinical signs or symptoms of infection and DVT/PE and directed to call the office immediately should any occur or go to the ER. -Follow-up in 10 days for possible suture removal or sooner if any problems arise. In the meantime, encouraged to call the office with any questions, concerns, change in symptoms.   Celesta Gentile, DPM

## 2018-05-27 DIAGNOSIS — J3081 Allergic rhinitis due to animal (cat) (dog) hair and dander: Secondary | ICD-10-CM | POA: Diagnosis not present

## 2018-05-27 DIAGNOSIS — J301 Allergic rhinitis due to pollen: Secondary | ICD-10-CM | POA: Diagnosis not present

## 2018-05-27 DIAGNOSIS — J3089 Other allergic rhinitis: Secondary | ICD-10-CM | POA: Diagnosis not present

## 2018-06-03 ENCOUNTER — Telehealth: Payer: Self-pay | Admitting: *Deleted

## 2018-06-03 ENCOUNTER — Encounter: Payer: Self-pay | Admitting: Podiatry

## 2018-06-03 ENCOUNTER — Ambulatory Visit (INDEPENDENT_AMBULATORY_CARE_PROVIDER_SITE_OTHER): Payer: Self-pay | Admitting: Podiatry

## 2018-06-03 DIAGNOSIS — M7989 Other specified soft tissue disorders: Secondary | ICD-10-CM

## 2018-06-03 DIAGNOSIS — Z9889 Other specified postprocedural states: Secondary | ICD-10-CM

## 2018-06-03 NOTE — Telephone Encounter (Signed)
Left message for pt to call for biopsy results of 05/19/2018.

## 2018-06-04 DIAGNOSIS — J3081 Allergic rhinitis due to animal (cat) (dog) hair and dander: Secondary | ICD-10-CM | POA: Diagnosis not present

## 2018-06-04 DIAGNOSIS — J3089 Other allergic rhinitis: Secondary | ICD-10-CM | POA: Diagnosis not present

## 2018-06-04 DIAGNOSIS — J301 Allergic rhinitis due to pollen: Secondary | ICD-10-CM | POA: Diagnosis not present

## 2018-06-07 NOTE — Progress Notes (Signed)
Subjective: Aaron Carpenter is a 52 y.o. is seen today in office s/p left foot soft tissue mass excision preformed on 05-19-2018.  He states to be having some mild discomfort to the area he feels it is tight towards the base of the toe.  Been taking ibuprofen to help with pain.  In the surgical shoe.  He states that overall he is doing well otherwise. Denies any systemic complaints such as fevers, chills, nausea, vomiting. No calf pain, chest pain, shortness of breath.   Objective: General: No acute distress, AAOx3  DP/PT pulses palpable 2/4, CRT < 3 sec to all digits.  Protective sensation intact. Motor function intact.  Left foot: Incision is well coapted without any evidence of dehiscence and sutures are intact. There is no surrounding erythema, ascending cellulitis, fluctuance, crepitus, malodor, drainage/purulence. There is minimal edema around the surgical site. There is mild pain along the surgical site.  No other areas of tenderness to bilateral lower extremities.  No other open lesions or pre-ulcerative lesions.  No pain with calf compression, swelling, warmth, erythema.   Assessment and Plan:  Status post left foot soft tissue mass excision, doing well with no complications   -Treatment options discussed including all alternatives, risks, and complications -Again reviewed pathology with him. -Incision appears to be healing well.  I removed half the sutures today without complications.  Antibiotic ointment and a bandage was applied.  Keep the dressing clean, dry, intact. -Remain weightbearing cam boot. -Ice/elevation -Pain medication as needed. -Monitor for any clinical signs or symptoms of infection and DVT/PE and directed to call the office immediately should any occur or go to the ER. -Follow-up in 10 days for suture removal or sooner if any problems arise. In the meantime, encouraged to call the office with any questions, concerns, change in symptoms.   Celesta Gentile, DPM

## 2018-06-10 ENCOUNTER — Encounter: Payer: Self-pay | Admitting: Podiatry

## 2018-06-10 ENCOUNTER — Ambulatory Visit (INDEPENDENT_AMBULATORY_CARE_PROVIDER_SITE_OTHER): Payer: Self-pay | Admitting: Podiatry

## 2018-06-10 DIAGNOSIS — Z9889 Other specified postprocedural states: Secondary | ICD-10-CM

## 2018-06-10 DIAGNOSIS — J3089 Other allergic rhinitis: Secondary | ICD-10-CM | POA: Diagnosis not present

## 2018-06-10 DIAGNOSIS — J301 Allergic rhinitis due to pollen: Secondary | ICD-10-CM | POA: Diagnosis not present

## 2018-06-10 DIAGNOSIS — J3081 Allergic rhinitis due to animal (cat) (dog) hair and dander: Secondary | ICD-10-CM | POA: Diagnosis not present

## 2018-06-10 DIAGNOSIS — M7989 Other specified soft tissue disorders: Secondary | ICD-10-CM

## 2018-06-10 NOTE — Progress Notes (Signed)
Subjective: Aaron Carpenter is a 52 y.o. is seen today in office s/p left foot soft tissue mass excision preformed on 05-19-2018. He states that he is doing better and the pain is improving. He presents today to remove the remainder of the suture. He has been in the CAM boot.  Denies any systemic complaints such as fevers, chills, nausea, vomiting. No calf pain, chest pain, shortness of breath.   Objective: General: No acute distress, AAOx3  DP/PT pulses palpable 2/4, CRT < 3 sec to all digits.  Protective sensation intact. Motor function intact.  Left foot: Incision is well coapted without any evidence of dehiscence and sutures are intact. There is no surrounding erythema, ascending cellulitis, fluctuance, crepitus, malodor, drainage/purulence. There is trace edema around the surgical site. There is mild pain along the surgical site.  No other areas of tenderness to bilateral lower extremities.  No other open lesions or pre-ulcerative lesions.  No pain with calf compression, swelling, warmth, erythema.   Assessment and Plan:  Status post left foot soft tissue mass excision, doing well with no complications   -Treatment options discussed including all alternatives, risks, and complications -The remainder of the sutures were removed today and the incision is well coapted. Steri-strips were applied for reinforcement all the incision appears to be healing well.  Small amount of Betadine was applied followed by dressing.  Continue with daily dressing changes.  Instructed shower tomorrow.  In about the next 5 days he can start to transition to regular shoe as he is able to.  Continue ice elevate.  Return in about 3 weeks (around 07/01/2018).  Trula Slade DPM

## 2018-06-18 DIAGNOSIS — J301 Allergic rhinitis due to pollen: Secondary | ICD-10-CM | POA: Diagnosis not present

## 2018-06-18 DIAGNOSIS — J3089 Other allergic rhinitis: Secondary | ICD-10-CM | POA: Diagnosis not present

## 2018-06-18 DIAGNOSIS — J3081 Allergic rhinitis due to animal (cat) (dog) hair and dander: Secondary | ICD-10-CM | POA: Diagnosis not present

## 2018-06-25 DIAGNOSIS — J301 Allergic rhinitis due to pollen: Secondary | ICD-10-CM | POA: Diagnosis not present

## 2018-06-25 DIAGNOSIS — J3089 Other allergic rhinitis: Secondary | ICD-10-CM | POA: Diagnosis not present

## 2018-06-25 DIAGNOSIS — J3081 Allergic rhinitis due to animal (cat) (dog) hair and dander: Secondary | ICD-10-CM | POA: Diagnosis not present

## 2018-06-28 DIAGNOSIS — J3089 Other allergic rhinitis: Secondary | ICD-10-CM | POA: Diagnosis not present

## 2018-06-28 DIAGNOSIS — J3081 Allergic rhinitis due to animal (cat) (dog) hair and dander: Secondary | ICD-10-CM | POA: Diagnosis not present

## 2018-07-08 ENCOUNTER — Encounter: Payer: Self-pay | Admitting: Podiatry

## 2018-07-08 ENCOUNTER — Ambulatory Visit (INDEPENDENT_AMBULATORY_CARE_PROVIDER_SITE_OTHER): Payer: 59 | Admitting: Podiatry

## 2018-07-08 DIAGNOSIS — J3089 Other allergic rhinitis: Secondary | ICD-10-CM | POA: Diagnosis not present

## 2018-07-08 DIAGNOSIS — L603 Nail dystrophy: Secondary | ICD-10-CM

## 2018-07-08 DIAGNOSIS — M79675 Pain in left toe(s): Secondary | ICD-10-CM

## 2018-07-08 DIAGNOSIS — M7989 Other specified soft tissue disorders: Secondary | ICD-10-CM | POA: Diagnosis not present

## 2018-07-08 DIAGNOSIS — J301 Allergic rhinitis due to pollen: Secondary | ICD-10-CM | POA: Diagnosis not present

## 2018-07-08 DIAGNOSIS — J3081 Allergic rhinitis due to animal (cat) (dog) hair and dander: Secondary | ICD-10-CM | POA: Diagnosis not present

## 2018-07-08 MED ORDER — CEPHALEXIN 500 MG PO CAPS: 500.0000 mg | ORAL_CAPSULE | Freq: Three times a day (TID) | ORAL | 0 refills | Status: DC

## 2018-07-08 NOTE — Patient Instructions (Signed)

## 2018-07-11 NOTE — Progress Notes (Signed)
Patient came in today to pick up custom made foot orthotics.  The goals were accomplished and the patient reported no dissatisfaction with said orthotics.  Patient was advised of breakin period and how to report any issues. 

## 2018-07-13 NOTE — Progress Notes (Signed)
Subjective: 52 year old male presents the office today for follow-up evaluation after having left foot soft tissue excision performed May 19, 2018.  He states from a surgical standpoint he is doing well however is concerned about the toenail on the left foot started to cause discomfort.  It been removed previously and the other glomus tumor present.  Since the surgery he states the nail started to lift up and the nail has chronically been thick as well as having yellow and brown discoloration.  He denies any redness or drainage or any swelling to the toenail site. Denies any systemic complaints such as fevers, chills, nausea, vomiting. No acute changes since last appointment, and no other complaints at this time.   Objective: AAO x3, NAD DP/PT pulses palpable bilaterally, CRT less than 3 seconds Scar from the prior surgery is well-healed there is no tenderness palpation of the surgical site.  The left hallux toenail is hypertrophic, dystrophic with yellow to brown discoloration which is chronic and ongoing prior to the surgery.  There is a small spicule of nail present to lateral nail border which is painful as well the toenail started to lift up and cause pain there is tenderness the entire toenail.  There is no edema, erythema, drainage or pus or any other signs of infection.  No open lesions or pre-ulcerative lesions.  No pain with calf compression, swelling, warmth, erythema  Assessment: Doing well status post left soft tissue mass excision, left hallux onychodystrophy/pain  Plan: -All treatment options discussed with the patient including all alternatives, risks, complications.  -From a surgical standpoint he is doing well.  Incision is well-healed there is no discomfort or signs of infection.  We discharged him from the postoperative course in the left foot.  However he has new concerns of his left toenail causing discomfort.  Given the distributed the nail as well as the reoccurrence of pain  I do recommend total nail avulsion with chemical matricectomy to help prevent the nail from coming back again.  I discussed alternatives, risks, complications.  He wishes to proceed today.  The toe was anesthetized with 3 cc of lidocaine, Marcaine plain.  Skin was prepped with alcohol.  Once anesthetized the skin was prepped in a sterile fashion and then a tourniquet was applied.  The toenail was removed in toto as well as the spicula toenail present.  Area was cleaned.  Underlying nail bed intact.  Phenol was applied under sooner conditions followed by irrigation with alcohol.  Silvadene was applied followed by dry sterile dressing.  Tourniquet released.  Tourniquet was removed there was an immediate cap refill time to the toe.  Tolerated well any complications.  Postprocedure instructions were discussed. -Patient encouraged to call the office with any questions, concerns, change in symptoms.   Trula Slade DPM

## 2018-07-15 ENCOUNTER — Ambulatory Visit (INDEPENDENT_AMBULATORY_CARE_PROVIDER_SITE_OTHER): Payer: 59

## 2018-07-15 DIAGNOSIS — L603 Nail dystrophy: Secondary | ICD-10-CM

## 2018-07-15 NOTE — Progress Notes (Signed)
Patient is here today for follow-up appointment, recent procedure performed on 07/08/2018, removal of left hallux nail.  He states that the area is a little tender but overall he feels like it is doing well.  He does state that the Epsom salt soaks or burning and he recently switched to vinegar soaks and is a burning as well.  No redness, no erythema, no swelling, no drainage, no signs and symptoms of infection.  There was some mild maceration surrounding the nail border, but overall the area appears to be healing well.  Discussed signs and symptoms of infection.  Verbal and written instructions given to the patient I did advise him to discontinue vinegar and Epson salt soaks and he was given instructions on antibacterial soap soaks.  He is to follow-up as needed with any acute symptom changes.

## 2018-07-15 NOTE — Patient Instructions (Addendum)
Please continue to soak until there is no pain, no redness and no drainage. Bandage on during the day and off at night. Report any signs of symptoms of infection, examples are: increased pain, increased swelling or drainage, increased redness. Please call with any questions or concerns  ANTIBACTERIAL SOAP INSTRUCTIONS  THE DAY AFTER PROCEDURE  Please follow the instructions your doctor has marked.   Shower as usual. Before getting out, place a drop of antibacterial liquid soap (Dial) on a wet, clean washcloth.  Gently wipe washcloth over affected area.  Afterward, rinse the area with warm water.  Blot the area dry with a soft cloth and cover with antibiotic ointment (neosporin, polysporin, bacitracin) and band aid or gauze and tape  Place 3-4 drops of antibacterial liquid soap in a quart of warm tap water.  Submerge foot into water for 20 minutes.  If bandage was applied after your procedure, leave on to allow for easy lift off, then remove and continue with soak for the remaining time.  Next, blot area dry with a soft cloth and cover with a bandage.  Apply other medications as directed by your doctor, such as cortisporin otic solution (eardrops) or neosporin antibiotic ointment

## 2018-07-16 DIAGNOSIS — J3089 Other allergic rhinitis: Secondary | ICD-10-CM | POA: Diagnosis not present

## 2018-07-16 DIAGNOSIS — J301 Allergic rhinitis due to pollen: Secondary | ICD-10-CM | POA: Diagnosis not present

## 2018-07-16 DIAGNOSIS — J3081 Allergic rhinitis due to animal (cat) (dog) hair and dander: Secondary | ICD-10-CM | POA: Diagnosis not present

## 2018-07-23 DIAGNOSIS — J301 Allergic rhinitis due to pollen: Secondary | ICD-10-CM | POA: Diagnosis not present

## 2018-07-23 DIAGNOSIS — J3081 Allergic rhinitis due to animal (cat) (dog) hair and dander: Secondary | ICD-10-CM | POA: Diagnosis not present

## 2018-07-23 DIAGNOSIS — J3089 Other allergic rhinitis: Secondary | ICD-10-CM | POA: Diagnosis not present

## 2018-07-30 DIAGNOSIS — J301 Allergic rhinitis due to pollen: Secondary | ICD-10-CM | POA: Diagnosis not present

## 2018-07-30 DIAGNOSIS — J3089 Other allergic rhinitis: Secondary | ICD-10-CM | POA: Diagnosis not present

## 2018-07-30 DIAGNOSIS — J3081 Allergic rhinitis due to animal (cat) (dog) hair and dander: Secondary | ICD-10-CM | POA: Diagnosis not present

## 2018-08-06 DIAGNOSIS — J3081 Allergic rhinitis due to animal (cat) (dog) hair and dander: Secondary | ICD-10-CM | POA: Diagnosis not present

## 2018-08-06 DIAGNOSIS — J3089 Other allergic rhinitis: Secondary | ICD-10-CM | POA: Diagnosis not present

## 2018-08-06 DIAGNOSIS — J301 Allergic rhinitis due to pollen: Secondary | ICD-10-CM | POA: Diagnosis not present

## 2018-08-13 DIAGNOSIS — J3081 Allergic rhinitis due to animal (cat) (dog) hair and dander: Secondary | ICD-10-CM | POA: Diagnosis not present

## 2018-08-13 DIAGNOSIS — J3089 Other allergic rhinitis: Secondary | ICD-10-CM | POA: Diagnosis not present

## 2018-08-13 DIAGNOSIS — J301 Allergic rhinitis due to pollen: Secondary | ICD-10-CM | POA: Diagnosis not present

## 2018-08-20 DIAGNOSIS — J3089 Other allergic rhinitis: Secondary | ICD-10-CM | POA: Diagnosis not present

## 2018-08-20 DIAGNOSIS — J3081 Allergic rhinitis due to animal (cat) (dog) hair and dander: Secondary | ICD-10-CM | POA: Diagnosis not present

## 2018-08-20 DIAGNOSIS — J301 Allergic rhinitis due to pollen: Secondary | ICD-10-CM | POA: Diagnosis not present

## 2018-08-27 DIAGNOSIS — J301 Allergic rhinitis due to pollen: Secondary | ICD-10-CM | POA: Diagnosis not present

## 2018-08-27 DIAGNOSIS — J3081 Allergic rhinitis due to animal (cat) (dog) hair and dander: Secondary | ICD-10-CM | POA: Diagnosis not present

## 2018-08-27 DIAGNOSIS — J3089 Other allergic rhinitis: Secondary | ICD-10-CM | POA: Diagnosis not present

## 2018-09-03 DIAGNOSIS — J301 Allergic rhinitis due to pollen: Secondary | ICD-10-CM | POA: Diagnosis not present

## 2018-09-03 DIAGNOSIS — J3081 Allergic rhinitis due to animal (cat) (dog) hair and dander: Secondary | ICD-10-CM | POA: Diagnosis not present

## 2018-09-03 DIAGNOSIS — J3089 Other allergic rhinitis: Secondary | ICD-10-CM | POA: Diagnosis not present

## 2018-09-10 DIAGNOSIS — J301 Allergic rhinitis due to pollen: Secondary | ICD-10-CM | POA: Diagnosis not present

## 2018-09-10 DIAGNOSIS — J3081 Allergic rhinitis due to animal (cat) (dog) hair and dander: Secondary | ICD-10-CM | POA: Diagnosis not present

## 2018-09-10 DIAGNOSIS — J3089 Other allergic rhinitis: Secondary | ICD-10-CM | POA: Diagnosis not present

## 2018-09-17 DIAGNOSIS — J3081 Allergic rhinitis due to animal (cat) (dog) hair and dander: Secondary | ICD-10-CM | POA: Diagnosis not present

## 2018-09-17 DIAGNOSIS — J301 Allergic rhinitis due to pollen: Secondary | ICD-10-CM | POA: Diagnosis not present

## 2018-09-17 DIAGNOSIS — J3089 Other allergic rhinitis: Secondary | ICD-10-CM | POA: Diagnosis not present

## 2018-09-24 DIAGNOSIS — J3089 Other allergic rhinitis: Secondary | ICD-10-CM | POA: Diagnosis not present

## 2018-09-24 DIAGNOSIS — J3081 Allergic rhinitis due to animal (cat) (dog) hair and dander: Secondary | ICD-10-CM | POA: Diagnosis not present

## 2018-09-24 DIAGNOSIS — J301 Allergic rhinitis due to pollen: Secondary | ICD-10-CM | POA: Diagnosis not present

## 2018-10-01 DIAGNOSIS — J3089 Other allergic rhinitis: Secondary | ICD-10-CM | POA: Diagnosis not present

## 2018-10-01 DIAGNOSIS — J301 Allergic rhinitis due to pollen: Secondary | ICD-10-CM | POA: Diagnosis not present

## 2018-10-01 DIAGNOSIS — J3081 Allergic rhinitis due to animal (cat) (dog) hair and dander: Secondary | ICD-10-CM | POA: Diagnosis not present

## 2018-10-08 DIAGNOSIS — J3089 Other allergic rhinitis: Secondary | ICD-10-CM | POA: Diagnosis not present

## 2018-10-08 DIAGNOSIS — J301 Allergic rhinitis due to pollen: Secondary | ICD-10-CM | POA: Diagnosis not present

## 2018-10-08 DIAGNOSIS — J3081 Allergic rhinitis due to animal (cat) (dog) hair and dander: Secondary | ICD-10-CM | POA: Diagnosis not present

## 2018-10-22 DIAGNOSIS — J3089 Other allergic rhinitis: Secondary | ICD-10-CM | POA: Diagnosis not present

## 2018-10-22 DIAGNOSIS — J301 Allergic rhinitis due to pollen: Secondary | ICD-10-CM | POA: Diagnosis not present

## 2018-10-22 DIAGNOSIS — J3081 Allergic rhinitis due to animal (cat) (dog) hair and dander: Secondary | ICD-10-CM | POA: Diagnosis not present

## 2018-11-02 DIAGNOSIS — J3081 Allergic rhinitis due to animal (cat) (dog) hair and dander: Secondary | ICD-10-CM | POA: Diagnosis not present

## 2018-11-02 DIAGNOSIS — J301 Allergic rhinitis due to pollen: Secondary | ICD-10-CM | POA: Diagnosis not present

## 2018-11-02 DIAGNOSIS — J3089 Other allergic rhinitis: Secondary | ICD-10-CM | POA: Diagnosis not present

## 2018-11-11 DIAGNOSIS — J3089 Other allergic rhinitis: Secondary | ICD-10-CM | POA: Diagnosis not present

## 2018-11-11 DIAGNOSIS — J3081 Allergic rhinitis due to animal (cat) (dog) hair and dander: Secondary | ICD-10-CM | POA: Diagnosis not present

## 2018-11-11 DIAGNOSIS — J301 Allergic rhinitis due to pollen: Secondary | ICD-10-CM | POA: Diagnosis not present

## 2018-11-18 DIAGNOSIS — J3089 Other allergic rhinitis: Secondary | ICD-10-CM | POA: Diagnosis not present

## 2018-11-18 DIAGNOSIS — J3081 Allergic rhinitis due to animal (cat) (dog) hair and dander: Secondary | ICD-10-CM | POA: Diagnosis not present

## 2018-11-18 DIAGNOSIS — J301 Allergic rhinitis due to pollen: Secondary | ICD-10-CM | POA: Diagnosis not present

## 2018-11-30 DIAGNOSIS — J3081 Allergic rhinitis due to animal (cat) (dog) hair and dander: Secondary | ICD-10-CM | POA: Diagnosis not present

## 2018-11-30 DIAGNOSIS — J301 Allergic rhinitis due to pollen: Secondary | ICD-10-CM | POA: Diagnosis not present

## 2018-11-30 DIAGNOSIS — J3089 Other allergic rhinitis: Secondary | ICD-10-CM | POA: Diagnosis not present

## 2018-12-09 DIAGNOSIS — J301 Allergic rhinitis due to pollen: Secondary | ICD-10-CM | POA: Diagnosis not present

## 2018-12-09 DIAGNOSIS — J3089 Other allergic rhinitis: Secondary | ICD-10-CM | POA: Diagnosis not present

## 2018-12-09 DIAGNOSIS — J3081 Allergic rhinitis due to animal (cat) (dog) hair and dander: Secondary | ICD-10-CM | POA: Diagnosis not present

## 2018-12-24 DIAGNOSIS — Z0000 Encounter for general adult medical examination without abnormal findings: Secondary | ICD-10-CM | POA: Diagnosis not present

## 2019-07-12 ENCOUNTER — Ambulatory Visit (INDEPENDENT_AMBULATORY_CARE_PROVIDER_SITE_OTHER): Payer: 59

## 2019-07-12 ENCOUNTER — Encounter: Payer: Self-pay | Admitting: Podiatry

## 2019-07-12 ENCOUNTER — Other Ambulatory Visit: Payer: Self-pay

## 2019-07-12 ENCOUNTER — Ambulatory Visit (INDEPENDENT_AMBULATORY_CARE_PROVIDER_SITE_OTHER): Payer: 59 | Admitting: Podiatry

## 2019-07-12 DIAGNOSIS — M722 Plantar fascial fibromatosis: Secondary | ICD-10-CM

## 2019-07-12 DIAGNOSIS — M778 Other enthesopathies, not elsewhere classified: Secondary | ICD-10-CM | POA: Diagnosis not present

## 2019-07-12 DIAGNOSIS — M792 Neuralgia and neuritis, unspecified: Secondary | ICD-10-CM

## 2019-07-18 IMAGING — MR MR FOOT*L* WO/W CM
9 series · 40 of 40 positions shown · IV contrast (Multihance 20ml)
Comparison: None.

CLINICAL DATA: Soft tissue mass of the first toe. Ongoing for 2
months.

EXAM:
MRI OF THE LEFT FOREFOOT WITHOUT AND WITH CONTRAST
TECHNIQUE: Multiplanar, multisequence MR imaging of the left forefoot was
performed both before and after administration of intravenous
contrast.
CONTRAST:  20mL MULTIHANCE GADOBENATE DIMEGLUMINE 529 MG/ML IV SOLN

[Series 4: T1 · coronal · left · 3.0mm · 0.31mm/px · 5 of 40 slices shown (1 of 2)]
[im 1/40]
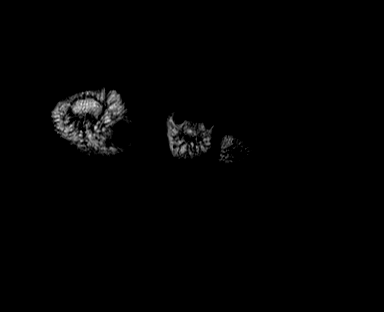
[im 10/40]
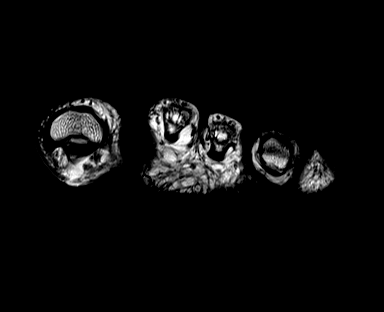
[im 20/40]
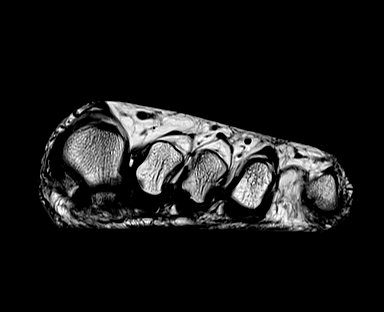
[im 30/40]
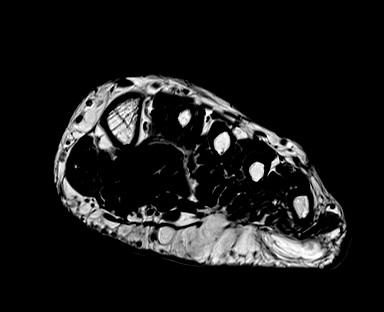
[im 40/40]
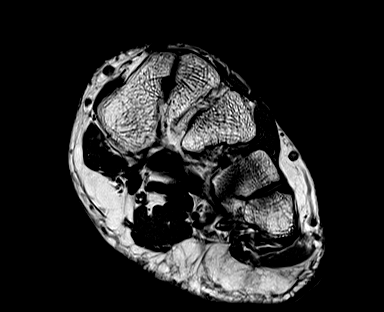

[Series 5: T2 fat-sat · coronal · left · 3.0mm · 0.31mm/px · 5 of 40 slices shown (1 of 2)]
[im 1/40]
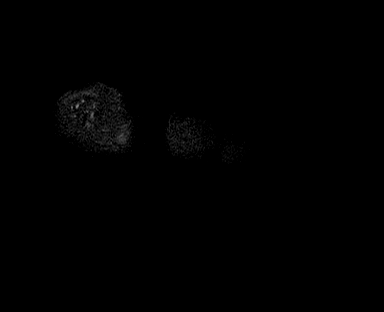
[im 10/40]
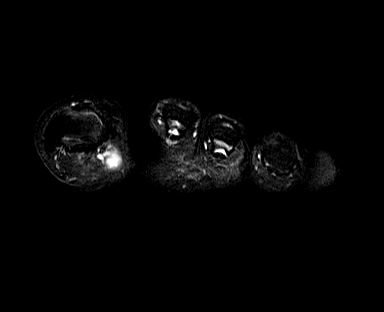
[im 20/40]
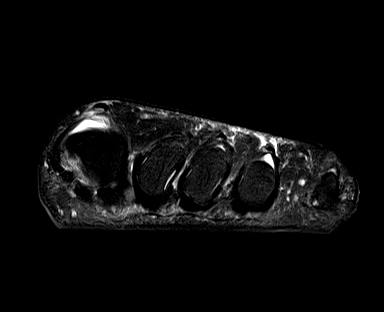
[im 30/40]
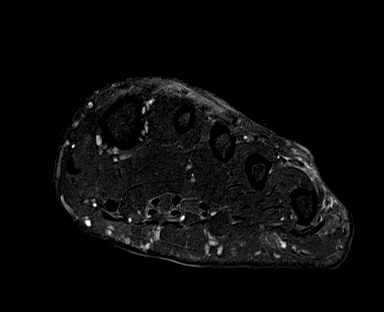
[im 40/40]
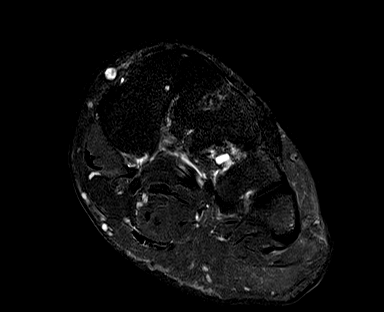

[Series 6: T1 · axial · left · 2.0mm · 0.47mm/px · z∈[-99,-46]mm · 4 of 30 slices shown (2 of 2)]
[im 1/30]
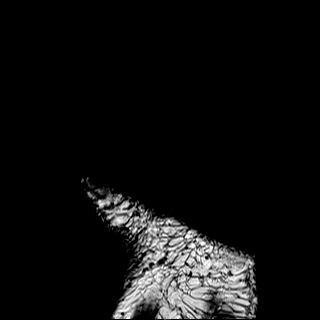
[im 10/30]
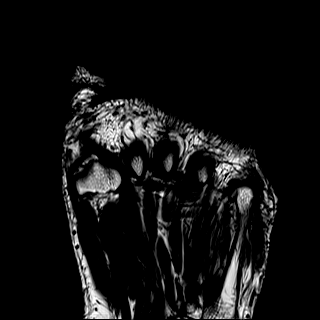
[im 20/30]
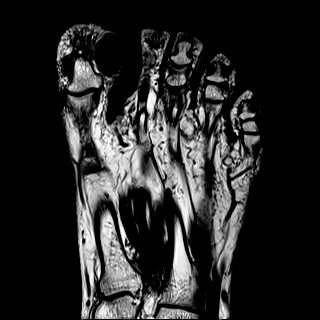
[im 30/30]
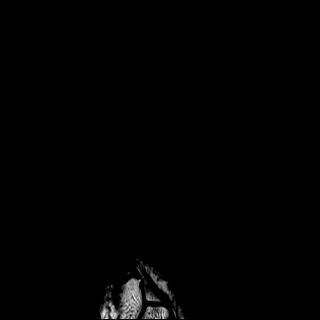

[Series 7: T2 fat-sat · axial · left · 2.0mm · 0.47mm/px · z∈[-99,-46]mm · 4 of 30 slices shown (2 of 2)]
[im 1/30]
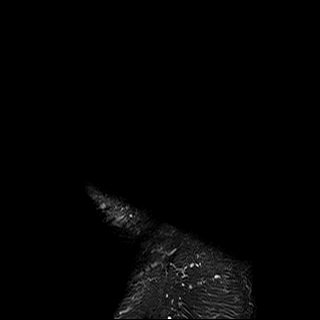
[im 10/30]
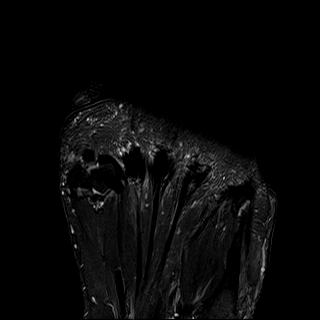
[im 20/30]
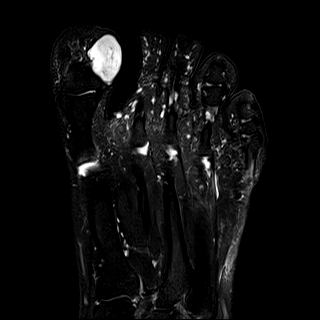
[im 30/30]
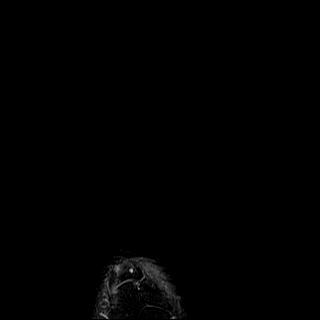

[Series 8: STIR · sagittal · left · 2.0mm · 0.59mm/px · 4 of 30 slices shown]
[im 1/30]
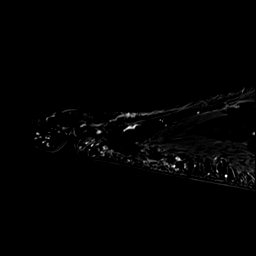
[im 10/30]
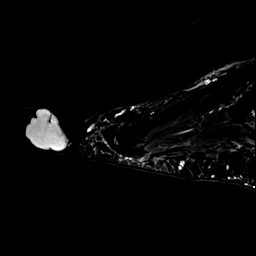
[im 20/30]
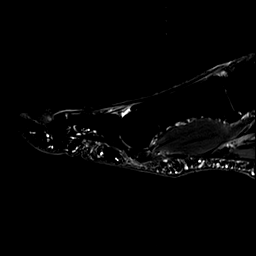
[im 30/30]
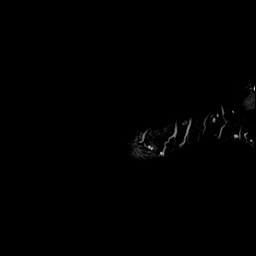

[Series 9: T1 fat-sat · coronal · non-contrast · left · 3.0mm · 0.31mm/px · 5 of 40 slices shown (1 of 3)]
[im 1/40]
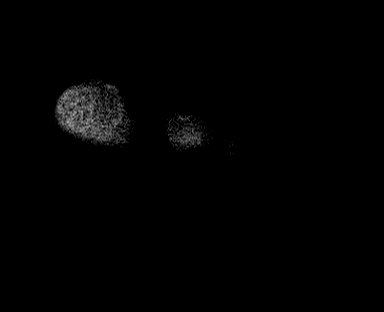
[im 10/40]
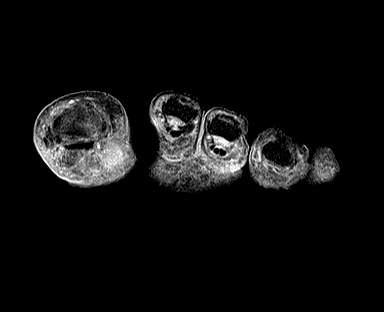
[im 20/40]
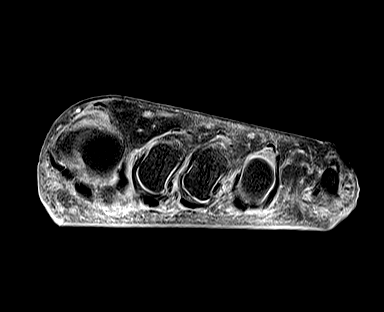
[im 30/40]
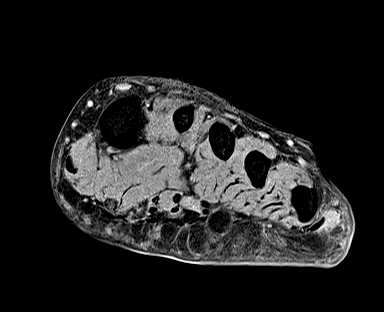
[im 40/40]
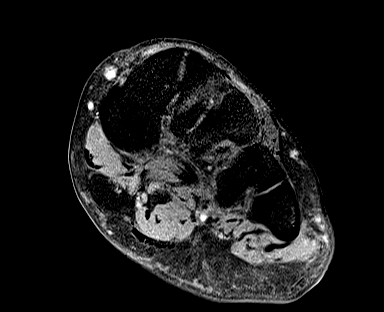

[Series 10: T1 fat-sat post-contrast · coronal · left · 3.0mm · 0.31mm/px · 5 of 40 slices shown]
[im 1/40]
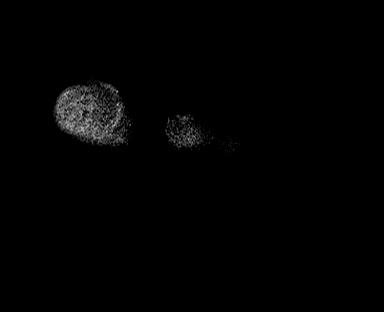
[im 10/40]
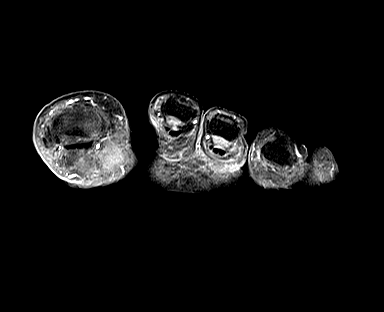
[im 20/40]
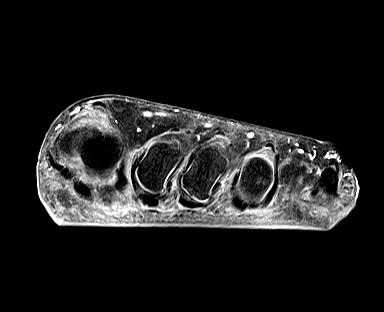
[im 30/40]
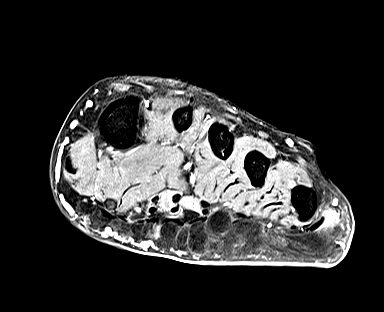
[im 40/40]
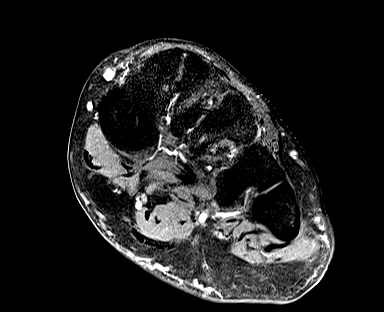

[Series 11: T1 fat-sat · axial · left · 2.0mm · 0.47mm/px · z∈[-99,-46]mm · 4 of 30 slices shown (2 of 3)]
[im 1/30]
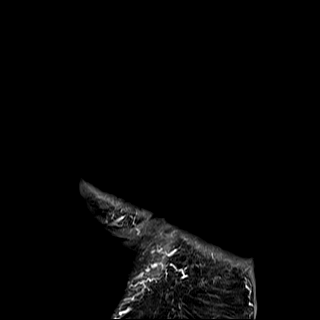
[im 10/30]
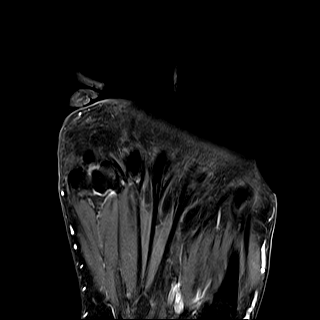
[im 20/30]
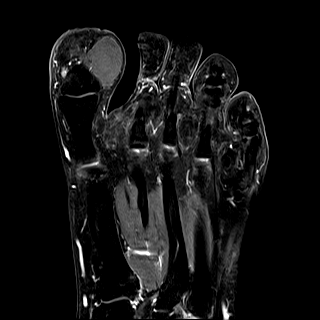
[im 30/30]
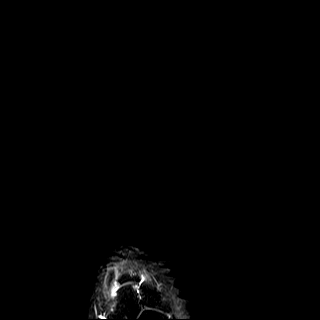

[Series 12: T1 fat-sat · sagittal · left · 2.0mm · 0.39mm/px · 4 of 29 slices shown (3 of 3)]
[im 1/29]
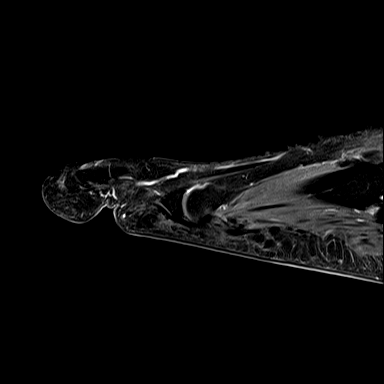
[im 10/29]
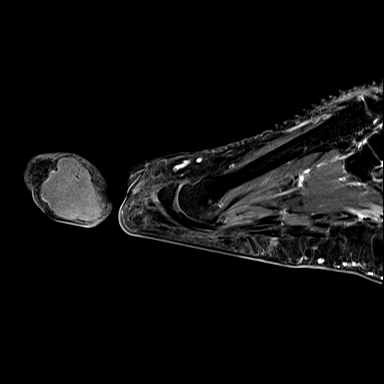
[im 19/29]
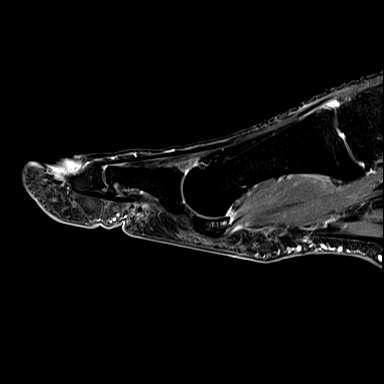
[im 29/29]
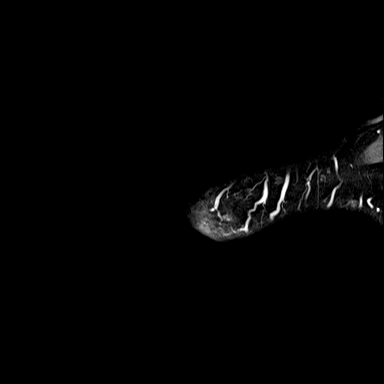

[40 of 40 positions shown; findings below may reference images not displayed]

FINDINGS: Bones/Joint/Cartilage

No marrow signal abnormality. No fracture or dislocation. Normal
alignment. No joint effusion.

Ligaments

Collateral ligaments are intact.  Lisfranc ligament is intact.

Muscles and Tendons
Flexor, peroneal and extensor compartment tendons are intact.
Muscles are normal.

Soft tissue
2 x 2.3 x 2.7 cm T2 hyperintense and T1 hypointense mass along the
medial aspect of the great toe abutting the flexor hallucis longus
tendon. Masses located within the subcutaneous fat. Few thin
internal septations are noted with mild hazy hypointensity on T2
weighted images. Mild thin peripheral enhancement. No internal
enhancement.
IMPRESSION: 2 x 2.3 x 2.7 cm nonenhancing mass along the medial aspect of the
great toe abutting the flexor hallucis longus tendon. Differential
considerations include ganglion cyst versus myxoma.

## 2019-07-20 NOTE — Progress Notes (Signed)
Subjective: 53 year old male presents the office today for 2 concerns.  He states that on the left big toe he has been having some discomfort on the incision as well as he gets some numbness and nerve pain to the toe.  He is not sure if the mass is coming back.  He denies any recent injury.  No increase in swelling or redness.  He also states having any pain to the right heel which is on the last 2 to 3 months he like to get inserts.  He gets pain after has been on his feet.  He feels a bruise sensation of the bottom of the heel.  He has no recent injury to this area.  No other concerns. Denies any systemic complaints such as fevers, chills, nausea, vomiting. No acute changes since last appointment, and no other complaints at this time.   Objective: AAO x3, NAD DP/PT pulses palpable bilaterally, CRT less than 3 seconds Incision on the left hallux is well-healed with mild scar tissue present.  He is describing more nerve symptoms.  There is no pain at the toe.  This does not seem to be any significant recurrence of the mass.  On the right side there is tenderness palpation on plantar medial tubercle of the calcaneus insertion of the plantar fascia.  Plantar fascial peers to be intact.  Negative Tinel sign. No open lesions or pre-ulcerative lesions.  No pain with calf compression, swelling, warmth, erythema  Assessment: Left hallux neuritis, right plantar fasciitis  Plan: -All treatment options discussed with the patient including all alternatives, risks, complications.  -X-rays obtained and reviewed.  Heel spurs present.  No evidence of acute fracture or cortical changes or any calcifications present. -We discussed her treatment on the left hallux.  Ordered a compound cream for nerve through [facility].  Discussed oral medications if needed. -In regards to the right heel he was measured for orthotics today with Liliane Channel.  Discussed stretching, icing daily.  Discussed shoe modifications.   -Patient encouraged to call the office with any questions, concerns, change in symptoms.   Trula Slade DPM

## 2019-08-11 ENCOUNTER — Ambulatory Visit: Payer: 59 | Admitting: Orthotics

## 2019-08-11 ENCOUNTER — Other Ambulatory Visit: Payer: Self-pay

## 2019-08-11 ENCOUNTER — Telehealth: Payer: Self-pay | Admitting: *Deleted

## 2019-08-11 DIAGNOSIS — M722 Plantar fascial fibromatosis: Secondary | ICD-10-CM

## 2019-08-11 DIAGNOSIS — M792 Neuralgia and neuritis, unspecified: Secondary | ICD-10-CM

## 2019-08-11 MED ORDER — NONFORMULARY OR COMPOUNDED ITEM: 5 refills | Status: AC

## 2019-08-11 MED ORDER — MELOXICAM 15 MG PO TABS: 15.0000 mg | ORAL_TABLET | Freq: Every day | ORAL | 0 refills | Status: AC

## 2019-08-11 NOTE — Telephone Encounter (Signed)
Patient came by the office today and stated that he has not received the compound nail lacquer or the medicine for his heel and I stated that I would resend the compound to the [facility] and order the mobic 15 mg 14 tablets and take one daily and to call the office if any concerns or questions. Lattie Haw

## 2019-08-11 NOTE — Addendum Note (Signed)
Addended by: Cranford Mon R on: 08/11/2019 09:37 AM   Modules accepted: Orders

## 2019-08-11 NOTE — Telephone Encounter (Signed)
Pt states he was to receive a compound for scar treatment not the previously ordered fungal treatment. Faxed orders to [facility].

## 2019-08-11 NOTE — Telephone Encounter (Signed)
I informed pt, I would send the rx for the scar compound to the [facility] now. Faxed orders to [facility].

## 2019-08-11 NOTE — Progress Notes (Signed)
Patient came in today to pick up custom made foot orthotics.  The goals were accomplished and the patient reported no dissatisfaction with said orthotics.  Patient was advised of breakin period and how to report any issues. 

## 2019-08-26 ENCOUNTER — Other Ambulatory Visit: Payer: 59 | Admitting: Orthotics

## 2019-09-21 ENCOUNTER — Other Ambulatory Visit: Payer: 59 | Admitting: Orthotics

## 2019-09-21 ENCOUNTER — Other Ambulatory Visit: Payer: Self-pay

## 2019-11-11 ENCOUNTER — Ambulatory Visit: Payer: 59 | Attending: Internal Medicine

## 2019-11-11 DIAGNOSIS — Z23 Encounter for immunization: Secondary | ICD-10-CM

## 2019-11-11 NOTE — Progress Notes (Signed)
   Covid-19 Vaccination Clinic  Name:  Aaron Carpenter    MRN: [id number] DOB: 03-19-1966  11/11/2019  Aaron Carpenter was observed post Covid-19 immunization for 15 minutes without incident. He was provided with Vaccine Information Sheet and instruction to access the V-Safe system.   Aaron Carpenter was instructed to call 911 with any severe reactions post vaccine: Marland Kitchen Difficulty breathing  . Swelling of face and throat  . A fast heartbeat  . A bad rash all over body  . Dizziness and weakness   Immunizations Administered    Name Date Dose VIS Date Route   Pfizer COVID-19 Vaccine 11/11/2019  8:11 AM 0.3 mL 07/15/2019 Intramuscular   Manufacturer: [place]   Lot: [id number]   [place]: [id number]

## 2019-12-05 ENCOUNTER — Ambulatory Visit: Payer: 59 | Attending: Internal Medicine

## 2019-12-05 DIAGNOSIS — Z23 Encounter for immunization: Secondary | ICD-10-CM

## 2019-12-05 NOTE — Progress Notes (Signed)
   Covid-19 Vaccination Clinic  Name:  [name]    MRN: [id number] DOB: [birth year]  12/05/2019  Mr. Martinique was observed post Covid-19 immunization for 15 minutes without incident. He was provided with Vaccine Information Sheet and instruction to access the V-Safe system.   Mr. Martinique was instructed to call 911 with any severe reactions post vaccine: Marland Kitchen Difficulty breathing  . Swelling of face and throat  . A fast heartbeat  . A bad rash all over body  . Dizziness and weakness   Immunizations Administered    Name Date Dose VIS Date Route   Pfizer COVID-19 Vaccine 12/05/2019  9:31 AM 0.3 mL 09/28/2018 Intramuscular   Manufacturer: [place]   Lot: [id number]   [place]: [id number]

## 2020-02-25 ENCOUNTER — Emergency Department (HOSPITAL_BASED_OUTPATIENT_CLINIC_OR_DEPARTMENT_OTHER): Payer: 59

## 2020-02-25 ENCOUNTER — Emergency Department (HOSPITAL_BASED_OUTPATIENT_CLINIC_OR_DEPARTMENT_OTHER)
Admission: EM | Admit: 2020-02-25 | Discharge: 2020-02-25 | Disposition: A | Discharge status: Home / Self Care | Payer: 59 | Attending: Emergency Medicine | Admitting: Emergency Medicine

## 2020-02-25 ENCOUNTER — Encounter (HOSPITAL_BASED_OUTPATIENT_CLINIC_OR_DEPARTMENT_OTHER): Payer: Self-pay | Admitting: Emergency Medicine

## 2020-02-25 DIAGNOSIS — Y9389 Activity, other specified: Secondary | ICD-10-CM | POA: Diagnosis not present

## 2020-02-25 DIAGNOSIS — M7918 Myalgia, other site: Secondary | ICD-10-CM

## 2020-02-25 DIAGNOSIS — Y999 Unspecified external cause status: Secondary | ICD-10-CM | POA: Diagnosis not present

## 2020-02-25 DIAGNOSIS — M791 Myalgia, unspecified site: Secondary | ICD-10-CM | POA: Diagnosis not present

## 2020-02-25 DIAGNOSIS — Z041 Encounter for examination and observation following transport accident: Secondary | ICD-10-CM | POA: Diagnosis present

## 2020-02-25 DIAGNOSIS — R161 Splenomegaly, not elsewhere classified: Secondary | ICD-10-CM | POA: Insufficient documentation

## 2020-02-25 DIAGNOSIS — Y92410 Unspecified street and highway as the place of occurrence of the external cause: Secondary | ICD-10-CM | POA: Insufficient documentation

## 2020-02-25 HISTORY — DX: Pure hypercholesterolemia, unspecified: E78.00

## 2020-02-25 LAB — BASIC METABOLIC PANEL
Anion gap: 13 (ref 5–15)
BUN: 14 mg/dL (ref 6–20)
CO2: 24 mmol/L (ref 22–32)
Calcium: 9 mg/dL (ref 8.9–10.3)
Chloride: 101 mmol/L (ref 98–111)
Creatinine, Ser: 1.02 mg/dL (ref 0.61–1.24)
GFR calc Af Amer: >60 mL/min (ref >60)
GFR calc non Af Amer: >60 mL/min (ref >60)
Glucose, Bld: 110 mg/dL — ABNORMAL HIGH (ref 70–99)
Potassium: 3.8 mmol/L (ref 3.5–5.1)
Sodium: 138 mmol/L (ref 135–145)

## 2020-02-25 LAB — TROPONIN I (HIGH SENSITIVITY)
Troponin I (High Sensitivity): 4 ng/L (ref <18)
Troponin I (High Sensitivity): 4 ng/L (ref <18)

## 2020-02-25 LAB — CBC
HCT: 42.1 % (ref 39.0–52.0)
Hemoglobin: 13.8 g/dL (ref 13.0–17.0)
MCH: 28.2 pg (ref 26.0–34.0)
MCHC: 32.8 g/dL (ref 30.0–36.0)
MCV: 86.1 fL (ref 80.0–100.0)
Platelets: 182 10*3/uL (ref 150–400)
RBC: 4.89 MIL/uL (ref 4.22–5.81)
RDW: 13.6 % (ref 11.5–15.5)
WBC: 9.1 10*3/uL (ref 4.0–10.5)
nRBC: 0 % (ref 0.0–0.2)

## 2020-02-25 MED ORDER — IOHEXOL 300 MG/ML  SOLN
100.0000 mL | Freq: Once | INTRAMUSCULAR | Status: AC | PRN
  Administered 2020-02-25: 100 mL via INTRAVENOUS
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
## 2020-02-29 ENCOUNTER — Other Ambulatory Visit: Payer: Self-pay | Admitting: Family Medicine

## 2020-02-29 DIAGNOSIS — R161 Splenomegaly, not elsewhere classified: Secondary | ICD-10-CM

## 2020-03-07 ENCOUNTER — Ambulatory Visit
Admission: RE | Admit: 2020-03-07 | Discharge: 2020-03-07 | Disposition: A | Discharge status: Home / Self Care | Payer: 59 | Source: Ambulatory Visit | Attending: Family Medicine | Admitting: Family Medicine

## 2020-03-07 DIAGNOSIS — R161 Splenomegaly, not elsewhere classified: Secondary | ICD-10-CM

## 2021-01-24 ENCOUNTER — Other Ambulatory Visit: Payer: Self-pay | Admitting: Family Medicine

## 2021-01-24 DIAGNOSIS — R161 Splenomegaly, not elsewhere classified: Secondary | ICD-10-CM

## 2021-02-18 ENCOUNTER — Ambulatory Visit
Admission: RE | Admit: 2021-02-18 | Discharge: 2021-02-18 | Disposition: A | Discharge status: Home / Self Care | Payer: 59 | Source: Ambulatory Visit | Attending: Family Medicine | Admitting: Family Medicine

## 2021-02-18 DIAGNOSIS — R161 Splenomegaly, not elsewhere classified: Secondary | ICD-10-CM

## 2021-05-27 ENCOUNTER — Telehealth: Payer: Self-pay | Admitting: Podiatry

## 2021-05-27 NOTE — Telephone Encounter (Signed)
Pt left message stating the orthotics he has is coming apart and pt is asking if they can be repaired.   I returned call and told pt we could get them refurbished and it is 90.00. He asked how to go about that and I told him to drop them off at the office and pay the 90.00 and I will send them out and call when they come back in. He is to be dropping them off in the next couple of days.

## 2021-05-29 DIAGNOSIS — M722 Plantar fascial fibromatosis: Secondary | ICD-10-CM

## 2021-06-10 ENCOUNTER — Telehealth: Payer: Self-pay | Admitting: Podiatry

## 2021-06-10 NOTE — Telephone Encounter (Signed)
Refurbished orthotics in.. pt aware ok to pick up. 

## 2022-04-24 ENCOUNTER — Other Ambulatory Visit: Payer: Self-pay | Admitting: Family Medicine

## 2022-04-24 DIAGNOSIS — R161 Splenomegaly, not elsewhere classified: Secondary | ICD-10-CM

## 2022-05-01 ENCOUNTER — Ambulatory Visit
Admission: RE | Admit: 2022-05-01 | Discharge: 2022-05-01 | Disposition: A | Discharge status: Home / Self Care | Payer: 59 | Source: Ambulatory Visit | Attending: Family Medicine | Admitting: Family Medicine

## 2022-05-01 DIAGNOSIS — R161 Splenomegaly, not elsewhere classified: Secondary | ICD-10-CM

## 2024-05-30 ENCOUNTER — Ambulatory Visit: Attending: Family Medicine

## 2024-05-30 ENCOUNTER — Other Ambulatory Visit: Payer: Self-pay | Admitting: *Deleted

## 2024-05-30 ENCOUNTER — Encounter: Payer: Self-pay | Admitting: *Deleted

## 2024-05-30 DIAGNOSIS — I4891 Unspecified atrial fibrillation: Secondary | ICD-10-CM

## 2024-05-30 DIAGNOSIS — I499 Cardiac arrhythmia, unspecified: Secondary | ICD-10-CM

## 2024-05-30 NOTE — Progress Notes (Signed)
 Patient ID: Aaron Carpenter, male   DOB: 1965-10-24, 58 y.o.   MRN: [id number] Patient enrolled for [facility] [employer] to ship a 13 day long term holter with sensitive skin Hydrocolloid Strips to the patients address on file. EP to read.

## 2024-05-30 NOTE — Progress Notes (Unsigned)
 [employer] 14 day long term holter with sensitive skin Hydrocolloid strips shipped to patients address on file.  EP to read.

## 2024-06-30 DIAGNOSIS — I4891 Unspecified atrial fibrillation: Secondary | ICD-10-CM

## 2024-06-30 DIAGNOSIS — I499 Cardiac arrhythmia, unspecified: Secondary | ICD-10-CM | POA: Diagnosis not present
